# Patient Record
Sex: Female | Born: 1995 | Race: White | Hispanic: No | Marital: Single | Smoking: Never smoker
Health system: Southern US, Community
[De-identification: ages and names within clinical notes are randomized; demographics above are authoritative.]

## PROBLEM LIST (undated history)

## (undated) DIAGNOSIS — J40 Bronchitis, not specified as acute or chronic: Secondary | ICD-10-CM

## (undated) DIAGNOSIS — F419 Anxiety disorder, unspecified: Secondary | ICD-10-CM

## (undated) DIAGNOSIS — M419 Scoliosis, unspecified: Secondary | ICD-10-CM

## (undated) DIAGNOSIS — M5430 Sciatica, unspecified side: Secondary | ICD-10-CM

## (undated) DIAGNOSIS — Z8701 Personal history of pneumonia (recurrent): Secondary | ICD-10-CM

## (undated) DIAGNOSIS — T7840XA Allergy, unspecified, initial encounter: Secondary | ICD-10-CM

## (undated) HISTORY — DX: Scoliosis, unspecified: M41.9

## (undated) HISTORY — DX: Sciatica, unspecified side: M54.30

## (undated) HISTORY — DX: Allergy, unspecified, initial encounter: T78.40XA

## (undated) HISTORY — DX: Anxiety disorder, unspecified: F41.9

## (undated) HISTORY — DX: Personal history of pneumonia (recurrent): Z87.01

## (undated) HISTORY — PX: WISDOM TOOTH EXTRACTION: SHX21

## (undated) HISTORY — DX: Bronchitis, not specified as acute or chronic: J40

---
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
## 2016-06-29 ENCOUNTER — Ambulatory Visit: Payer: Medicaid Other | Admitting: Physical Therapy

## 2016-07-06 ENCOUNTER — Encounter: Payer: Medicaid Other | Admitting: Physical Therapy

## 2016-07-09 ENCOUNTER — Encounter: Payer: Medicaid Other | Admitting: Physical Therapy

## 2017-06-21 DIAGNOSIS — Z00129 Encounter for routine child health examination without abnormal findings: Secondary | ICD-10-CM | POA: Diagnosis not present

## 2017-06-21 DIAGNOSIS — Z0000 Encounter for general adult medical examination without abnormal findings: Secondary | ICD-10-CM | POA: Diagnosis not present

## 2017-06-21 DIAGNOSIS — Z713 Dietary counseling and surveillance: Secondary | ICD-10-CM | POA: Diagnosis not present

## 2017-09-21 DIAGNOSIS — F411 Generalized anxiety disorder: Secondary | ICD-10-CM | POA: Diagnosis not present

## 2017-10-04 DIAGNOSIS — F411 Generalized anxiety disorder: Secondary | ICD-10-CM | POA: Diagnosis not present

## 2017-10-06 DIAGNOSIS — F411 Generalized anxiety disorder: Secondary | ICD-10-CM | POA: Diagnosis not present

## 2017-10-13 DIAGNOSIS — F411 Generalized anxiety disorder: Secondary | ICD-10-CM | POA: Diagnosis not present

## 2017-10-20 DIAGNOSIS — F411 Generalized anxiety disorder: Secondary | ICD-10-CM | POA: Diagnosis not present

## 2017-10-27 DIAGNOSIS — F411 Generalized anxiety disorder: Secondary | ICD-10-CM | POA: Diagnosis not present

## 2017-11-03 DIAGNOSIS — F411 Generalized anxiety disorder: Secondary | ICD-10-CM | POA: Diagnosis not present

## 2017-11-10 DIAGNOSIS — F411 Generalized anxiety disorder: Secondary | ICD-10-CM | POA: Diagnosis not present

## 2017-11-17 DIAGNOSIS — F411 Generalized anxiety disorder: Secondary | ICD-10-CM | POA: Diagnosis not present

## 2017-12-22 DIAGNOSIS — F411 Generalized anxiety disorder: Secondary | ICD-10-CM | POA: Diagnosis not present

## 2018-01-26 DIAGNOSIS — F411 Generalized anxiety disorder: Secondary | ICD-10-CM | POA: Diagnosis not present

## 2018-02-21 DIAGNOSIS — F411 Generalized anxiety disorder: Secondary | ICD-10-CM | POA: Diagnosis not present

## 2018-03-23 DIAGNOSIS — F411 Generalized anxiety disorder: Secondary | ICD-10-CM | POA: Diagnosis not present

## 2018-07-19 DIAGNOSIS — M4120 Other idiopathic scoliosis, site unspecified: Secondary | ICD-10-CM | POA: Diagnosis not present

## 2018-07-19 DIAGNOSIS — Z23 Encounter for immunization: Secondary | ICD-10-CM | POA: Diagnosis not present

## 2018-07-19 DIAGNOSIS — Z0000 Encounter for general adult medical examination without abnormal findings: Secondary | ICD-10-CM | POA: Diagnosis not present

## 2018-07-19 DIAGNOSIS — J309 Allergic rhinitis, unspecified: Secondary | ICD-10-CM | POA: Diagnosis not present

## 2018-07-19 DIAGNOSIS — M40209 Unspecified kyphosis, site unspecified: Secondary | ICD-10-CM | POA: Diagnosis not present

## 2018-07-19 DIAGNOSIS — Z6823 Body mass index (BMI) 23.0-23.9, adult: Secondary | ICD-10-CM | POA: Diagnosis not present

## 2018-07-19 DIAGNOSIS — Z713 Dietary counseling and surveillance: Secondary | ICD-10-CM | POA: Diagnosis not present

## 2018-09-19 DIAGNOSIS — Z124 Encounter for screening for malignant neoplasm of cervix: Secondary | ICD-10-CM | POA: Diagnosis not present

## 2018-09-19 DIAGNOSIS — Z113 Encounter for screening for infections with a predominantly sexual mode of transmission: Secondary | ICD-10-CM | POA: Diagnosis not present

## 2018-10-16 DIAGNOSIS — F411 Generalized anxiety disorder: Secondary | ICD-10-CM | POA: Diagnosis not present

## 2019-05-02 ENCOUNTER — Encounter: Payer: Self-pay | Admitting: Family Medicine

## 2019-05-02 ENCOUNTER — Ambulatory Visit (INDEPENDENT_AMBULATORY_CARE_PROVIDER_SITE_OTHER): Payer: Medicaid Other | Admitting: Family Medicine

## 2019-05-02 ENCOUNTER — Other Ambulatory Visit: Payer: Self-pay

## 2019-05-02 VITALS — BP 116/78 | HR 95 | Temp 98.5°F | Resp 14 | Ht 61.0 in | Wt 127.9 lb

## 2019-05-02 DIAGNOSIS — Z7689 Persons encountering health services in other specified circumstances: Secondary | ICD-10-CM

## 2019-05-02 DIAGNOSIS — J4520 Mild intermittent asthma, uncomplicated: Secondary | ICD-10-CM | POA: Diagnosis not present

## 2019-05-02 DIAGNOSIS — Z7251 High risk heterosexual behavior: Secondary | ICD-10-CM

## 2019-05-02 DIAGNOSIS — F419 Anxiety disorder, unspecified: Secondary | ICD-10-CM | POA: Diagnosis not present

## 2019-05-02 DIAGNOSIS — Z9109 Other allergy status, other than to drugs and biological substances: Secondary | ICD-10-CM | POA: Diagnosis not present

## 2019-05-02 DIAGNOSIS — Z8701 Personal history of pneumonia (recurrent): Secondary | ICD-10-CM | POA: Diagnosis not present

## 2019-05-02 DIAGNOSIS — Z3009 Encounter for other general counseling and advice on contraception: Secondary | ICD-10-CM

## 2019-05-02 MED ORDER — NORETHIN ACE-ETH ESTRAD-FE 1-20 MG-MCG PO TABS: 1.0000 | ORAL_TABLET | Freq: Every day | ORAL | 11 refills | Status: DC

## 2019-05-02 NOTE — Progress Notes (Signed)
Name: Crystal Hensley   MRN: [id number]    DOB: [birth year]   Date:05/02/2019       Progress Note  Chief Complaint  Patient presents with  . Establish Care    see's psych at [facility] for depression and med  . Contraception    currently on junel pill wants to see if she can switch to shots, so she want forget, Recently tried to have a pap at [facility] unable to do so do to pain     Subjective:   Crystal Hensley is a 24 y.o. female, presents to clinic to establish care  Pt is here with her aunt [place] S[place] (Celine Ahr on her dads side) mom just died last week? Is a very confusing history given about her biological mom and her other mom who raised her and one of her mom's just died but she also just moved in with one of her mom's.  Patient is a poor historian when attempting to redirect or clarify questions. Later after some clarification with her aunt who is here, her biological mom only had her for the first 2 weeks of life and then she went to live with one of her aunts was essentially her mom and raised her her entire life.  About a month ago she passed away suddenly, they did not discuss reason about what caused her death, but the patient then went to live with her biological mom in the past month for the first time ever and she is living with her mom and her mom's boyfriend and her spouse's stepfather? Since living there a lot of change in her life, she reports going to times a week to give plasma donations and the patient mentions that she is concerned about bruising.  Patient has a history of anxiety and depression?  This is managed at [facility] She is on zoloft 100 mg has been on this for years. Her psychiatrist passed away recently and she is going to see a new md coming up on her 6 month f/up.  She has not had any trouble getting her medications refilled at the clinic.  She does not know she has had any other specific diagnoses made about her.  There is some family history of some psychiatric or developmental and  intellectual disabilities or illness but neither  Sink or the patient state any specific known family history or diagnoses.  Patient also has a history of pneumonia and bronchitis/ "touch of bronchitis" uses inhalers at least once a year, but they do not believe that she is ever been diagnosed with asthma.  Patient did want to discuss switching birth control possibly to a different form like a Depo shot.  She is currently on Janel, she is sexually active and has had more than 1 female partner in the past year.  Patient is extremely anxious about letter her medical care and unfortunately they attempted to do a Pap smear last July but it was unsuccessful she had severe anxiety and that Pap was never attempted in office.  Similar problems going to the dentist but they been able to work through this. Reviewed records from that encounter  Assessment and Plan   24 y.o. G0 here for pap smear and STI testing  #. Health Maintenance: Unable to perform pap today 2/2 patient intolerance. Recommended return to clinic with PO ativan vs IV light sedation.  -- Attempted to collect urine specimen for GCCT; however, pt was not able to void.  -- Will RTC prn  #.  Contraception counseling: Pt endorses HA with OCPs and would like to try a different OCP. Declined all other forms of contraception.  -- Rx Sprintec  Sharen Hint, MD, MPH Complex Family Planning Fellow, [facility] Date: 09/19/2018 Time: 12:45 PM  Patient is tolerating the [place] birth control and she is taking it regularly she reports light periods that are fairly regular with withdrawal bleed 3-4 days  No hematuria, bleeding with brushing teeth, spontaneous    Plasma Ked - donating 2x a week concerned with bleeding/bruising   Discuss separately with the and inquiring about any further known medical history with the patient or with her family.  She states that she suspects the patient and her mother both have some slight intellectual  disability but unclear if patient functions fully independent or will need a guardian?  She does have difficult time answering questions appropriately she is a poor historian    Patient Active Problem List   Diagnosis Date Noted  . Anxiety     Past Surgical History:  Procedure Laterality Date  . WISDOM TOOTH EXTRACTION      Family History  Problem Relation Age of Onset  . Depression Mother   . Diabetes Maternal Grandmother   . Arthritis Maternal Grandmother   . COPD Maternal Grandmother   . Lung cancer Maternal Grandfather   . Pneumonia Maternal Grandfather   . Heart disease Maternal Grandfather   . Diabetes Paternal Grandmother   . COPD Paternal Grandmother   . Hypertension Paternal Grandmother     Social History   Tobacco Use  . Smoking status: Never Smoker  . Smokeless tobacco: Never Used  Substance Use Topics  . Alcohol use: Never  . Drug use: Never      Current Outpatient Medications:  .  norgestimate-ethinyl estradiol (ORTHO-CYCLEN) 0.25-35 MG-MCG tablet, Take by mouth., Disp: , Rfl:  .  sertraline (ZOLOFT) 100 MG tablet, Take 100 mg by mouth daily., Disp: , Rfl:   No Known Allergies  Chart Review Today: I personally reviewed active problem list, medication list, allergies, family history, social history, health maintenance, notes from last encounter, lab results, imaging with the patient/caregiver today.   Review of Systems  10 Systems reviewed and are negative for acute change except as noted in the HPI.     Objective:    Vitals:   05/02/19 1441  BP: 116/78  Pulse: 95  Resp: 14  Temp: 98.5 F (36.9 C)  SpO2: 99%  Weight: 127 lb 14.4 oz (58 kg)  Height: 5\' 1"  (1.549 m)    Body mass index is 24.17 kg/m.  Physical Exam Vitals and nursing note reviewed.  Constitutional:      General: She is not in acute distress.    Appearance: Normal appearance. She is well-developed. She is not ill-appearing, toxic-appearing or diaphoretic.      Interventions: Face mask in place.  HENT:     Head: Normocephalic and atraumatic.     Right Ear: External ear normal.     Left Ear: External ear normal.  Eyes:     General: Lids are normal. No scleral icterus.       Right eye: No discharge.        Left eye: No discharge.     Conjunctiva/sclera: Conjunctivae normal.  Neck:     Trachea: Phonation normal. No tracheal deviation.  Cardiovascular:     Rate and Rhythm: Normal rate and regular rhythm.     Pulses: Normal pulses.  Radial pulses are 2+ on the right side and 2+ on the left side.       Posterior tibial pulses are 2+ on the right side and 2+ on the left side.     Heart sounds: Normal heart sounds. No murmur. No friction rub. No gallop.   Pulmonary:     Effort: Pulmonary effort is normal. No respiratory distress.     Breath sounds: Normal breath sounds. No stridor. No wheezing, rhonchi or rales.  Chest:     Chest wall: No tenderness.  Abdominal:     General: Bowel sounds are normal. There is no distension.     Palpations: Abdomen is soft.     Tenderness: There is no abdominal tenderness. There is no guarding or rebound.  Musculoskeletal:        General: No deformity. Normal range of motion.     Cervical back: Normal range of motion and neck supple.     Right lower leg: No edema.     Left lower leg: No edema.  Lymphadenopathy:     Cervical: No cervical adenopathy.  Skin:    General: Skin is warm and dry.     Capillary Refill: Capillary refill takes less than 2 seconds.     Coloration: Skin is not jaundiced or pale.     Findings: No rash.  Neurological:     Mental Status: She is alert.     Motor: No abnormal muscle tone.     Gait: Gait normal.  Psychiatric:        Attention and Perception: She is inattentive.        Mood and Affect: Mood is anxious. Mood is not depressed.        Thought Content: Thought content does not include homicidal or suicidal ideation.     Comments: Slightly hyperactive, inattentive and  anxious appearing Poor eye contact Does not answer questions appropriately -has trouble answering questions directly and when discussing certain things she will correct me on pronunciation or spelling of medications       PHQ2/9: Depression screen PHQ 2/9 05/02/2019  Decreased Interest 0  Down, Depressed, Hopeless 1  PHQ - 2 Score 1  Altered sleeping 0  Tired, decreased energy 0  Change in appetite 0  Feeling bad or failure about yourself  0  Trouble concentrating 0  Moving slowly or fidgety/restless 0  Suicidal thoughts 0  PHQ-9 Score 1    phq 9 is neg  Fall Risk: Fall Risk  05/02/2019  Falls in the past year? 0  Number falls in past yr: 0  Injury with Fall? 0    Functional Status Survey: Is the patient deaf or have difficulty hearing?: No Does the patient have difficulty seeing, even when wearing glasses/contacts?: No Does the patient have difficulty concentrating, remembering, or making decisions?: No Does the patient have difficulty walking or climbing stairs?: No Does the patient have difficulty dressing or bathing?: No Does the patient have difficulty doing errands alone such as visiting a doctor's office or shopping?: No   Assessment & Plan:     ICD-10-CM   1. Anxiety  F41.9    Unspecified diagnosis or history requesting [facility] records to see past formal assessments  2. Multiple environmental allergies  Z91.09    Encouraged use of over-the-counter antihistamines and steroid nasal sprays avoiding triggers  3. High risk sexual behavior, unspecified type  Z72.51    Encouraged her to use condoms, to be compliant with birth control to avoid pregnancy  4. Birth control counseling  Z30.09    Discussed sexual health, pelvic exams and cervical cancer screening, discussed the pill versus Depo shot they will return once you had time to decide  5. Mild intermittent asthmatic bronchitis without complication  J45.20    Given history suspicious for some mild asthma or asthmatic  bronchitis triggered by living with secondhand smoke exposure has improved since moving  6. History of pneumonia  Z87.01    History of pneumonia and bronchitis no recent pneumonia no known complications at birth or hospitalizations in the past  7. Encounter to establish care with new doctor  [phone]    Requesting records from psychiatry, reviewed available records through care everywhere and our own EMR   Unclear if the patient has some developmental or intellectual disabilities or possibly has some slight autism or -asked Asperger's? or is on the spectrum?  She behaves very childlike and has a nervous energy is also slightly aloof and then sometimes very specific and articulate incorrectly me on pronunciation of medications or large words but other times cannot answer questions appropriately or give a clear coherent history She is with her aunt but does seem to have lived with multiple other family members and its unclear if she is fully dependent or not?  I hope to get records from [facility] to see if there is any past formal assessment related to this with either her intellect, behavior, past psychiatric diagnoses or assessments?    Patient will return for further discussion of birth control options -Depo may be a great choice for her    Return for f/up in 2 months for CPE/birth control 40 min.   Danelle Berry, PA-C 05/02/19 3:08 PM

## 2019-05-02 NOTE — Patient Instructions (Signed)
Mychart Login name:  [email or web address]  Password:    I reset   Password123

## 2019-05-09 ENCOUNTER — Encounter: Payer: Self-pay | Admitting: Family Medicine

## 2019-05-11 ENCOUNTER — Encounter: Payer: Self-pay | Admitting: Family Medicine

## 2019-05-15 ENCOUNTER — Ambulatory Visit (INDEPENDENT_AMBULATORY_CARE_PROVIDER_SITE_OTHER): Payer: Medicaid Other | Admitting: Family Medicine

## 2019-05-15 ENCOUNTER — Encounter: Payer: Self-pay | Admitting: Family Medicine

## 2019-05-15 ENCOUNTER — Other Ambulatory Visit: Payer: Self-pay

## 2019-05-15 VITALS — BP 116/78 | HR 84 | Temp 98.3°F | Resp 14 | Ht 62.0 in | Wt 131.0 lb

## 2019-05-15 DIAGNOSIS — Z658 Other specified problems related to psychosocial circumstances: Secondary | ICD-10-CM | POA: Diagnosis not present

## 2019-05-15 DIAGNOSIS — R21 Rash and other nonspecific skin eruption: Secondary | ICD-10-CM

## 2019-05-15 MED ORDER — HYDROXYZINE HCL 10 MG PO TABS: 10.0000 mg | ORAL_TABLET | Freq: Three times a day (TID) | ORAL | 1 refills | Status: DC | PRN

## 2019-05-15 MED ORDER — CETIRIZINE HCL 10 MG PO TABS: 10.0000 mg | ORAL_TABLET | Freq: Every day | ORAL | 11 refills | Status: DC

## 2019-05-15 MED ORDER — PREDNISONE 20 MG PO TABS: 40.0000 mg | ORAL_TABLET | Freq: Every day | ORAL | 0 refills | Status: AC

## 2019-05-15 NOTE — Patient Instructions (Addendum)
For rash start taking a daily zyrtec (prescribed for you) and use vaseline to hydrate skin.  Start the prednisone tomorrow morning for wide spread rash  You can use hydroxyzine as needed for itching    Atopic Dermatitis Atopic dermatitis is a skin disorder that causes inflammation of the skin. This is the most common type of eczema. Eczema is a group of skin conditions that cause the skin to be itchy, red, and swollen. This condition is generally worse during the cooler winter months and often improves during the warm summer months. Symptoms can vary from person to person. Atopic dermatitis usually starts showing signs in infancy and can last through adulthood. This condition cannot be passed from one person to another (non-contagious), but it is more common in families. Atopic dermatitis may not always be present. When it is present, it is called a flare-up. What are the causes? The exact cause of this condition is not known. Flare-ups of the condition may be triggered by:  Contact with something that you are sensitive or allergic to.  Stress.  Certain foods.  Extremely hot or cold weather.  Harsh chemicals and soaps.  Dry air.  Chlorine. What increases the risk? This condition is more likely to develop in people who have a personal history or family history of eczema, allergies, asthma, or hay fever. What are the signs or symptoms? Symptoms of this condition include:  Dry, scaly skin.  Red, itchy rash.  Itchiness, which can be severe. This may occur before the skin rash. This can make sleeping difficult.  Skin thickening and cracking that can occur over time. How is this diagnosed? This condition is diagnosed based on your symptoms, a medical history, and a physical exam. How is this treated? There is no cure for this condition, but symptoms can usually be controlled. Treatment focuses on:  Controlling the itchiness and scratching. You may be given medicines, such as  antihistamines or steroid creams.  Limiting exposure to things that you are sensitive or allergic to (allergens).  Recognizing situations that cause stress and developing a plan to manage stress. If your atopic dermatitis does not get better with medicines, or if it is all over your body (widespread), a treatment using a specific type of light (phototherapy) may be used. Follow these instructions at home: Skin care   Keep your skin well-moisturized. Doing this seals in moisture and helps to prevent dryness. ? Use unscented lotions that have petroleum in them. ? Avoid lotions that contain alcohol or water. They can dry the skin.  Keep baths or showers short (less than 5 minutes) in warm water. Do not use hot water. ? Use mild, unscented cleansers for bathing. Avoid soap and bubble bath. ? Apply a moisturizer to your skin right after a bath or shower.  Do not apply anything to your skin without checking with your health care provider. General instructions  Dress in clothes made of cotton or cotton blends. Dress lightly because heat increases itchiness.  When washing your clothes, rinse your clothes twice so all of the soap is removed.  Avoid any triggers that can cause a flare-up.  Try to manage your stress.  Keep your fingernails cut short.  Avoid scratching. Scratching makes the rash and itchiness worse. It may also result in a skin infection (impetigo) due to a break in the skin caused by scratching.  Take or apply over-the-counter and prescription medicines only as told by your health care provider.  Keep all follow-up visits as  told by your health care provider. This is important.  Do not be around people who have cold sores or fever blisters. If you get the infection, it may cause your atopic dermatitis to worsen. Contact a health care provider if:  Your itchiness interferes with sleep.  Your rash gets worse or it is not better within one week of starting  treatment.  You have a fever.  You have a rash flare-up after having contact with someone who has cold sores or fever blisters. Get help right away if:  You develop pus or soft yellow scabs in the rash area. Summary  This condition causes a red rash and itchy, dry, scaly skin.  Treatment focuses on controlling the itchiness and scratching, limiting exposure to things that you are sensitive or allergic to (allergens), recognizing situations that cause stress, and developing a plan to manage stress.  Keep your skin well-moisturized.  Keep baths or showers shorter than 5 minutes and use warm water. Do not use hot water. This information is not intended to replace advice given to you by your health care provider. Make sure you discuss any questions you have with your health care provider. Document Revised: 05/30/2018 Document Reviewed: 03/12/2016 Elsevier Patient Education  2020 [employer].

## 2019-05-15 NOTE — Progress Notes (Signed)
Patient ID: Crystal Hensley, female    DOB: 09/07/1995, 24 y.o.   MRN: [id number]  PCP: Delsa Grana, PA-C  Chief Complaint  Patient presents with  . Letter for School/Work    for SS for her to care for self/referral    Subjective:   Crystal Hensley is a 24 y.o. female, presents to clinic with CC of the following:  HPI  Patient presents by herself, her aunt is not with her today, she does not bring any paperwork with her either but she is here to request a letter so that she can state that she is able to take care of herself so that she is able to get her Social Security monies and manage them on her own. Pt was raised by her Crystal Hensley just passed away 2-3 weeks ago.  Her Overly had POA and got money for her every month.  She is currently living her with biological mom Crystal Hensley and her boyfriend -family history given by the aunt and somewhat confirmed by the patient today is that patient's biological mother has some diagnoses such as intellectual development or some psychiatric conditions which the Department of Social Services has deemed they are not eligible to manage Crystal Hensley's money for her and they are not eligible to be her POA.   Patient's medical history is still not yet known because she came by her's self today previously came with her and in the history is very convoluted had no records available from pediatrics or from psychiatry though we have requested them in the past.  She is going to [employer] today.  She states that when she turned 21 and 36 POA was designated she was at that time assessed and seeing a psychiatrist at [facility].  Her psychiatrist Dr. Randel Books recently passed away and she is currently seeing Dr. Kari Baars?   Patient also complains of a itchy rash and bumps that are all over her body.  Her visit last week she had scratches across her back from itching but that time there was no visible rash.  She reports dry skin and bumps all over her body she pulls up her legs and there  are scratches and bumps scattered everywhere, no new known soaps detergents new medications lotions soaps or body wash but she did recently move from her aunts house to her mother's house.  She reports some history of eczema and allergies that are not managed by any over-the-counter or prescription medications.  She reports putting on some lotion that does not help much but she does not know what it is called    Patient Active Problem List   Diagnosis Date Noted  . Anxiety       Current Outpatient Medications:  .  norethindrone-ethinyl estradiol (JUNEL FE 1/20) 1-20 MG-MCG tablet, Take 1 tablet by mouth daily., Disp: 1 Package, Rfl: 11 .  sertraline (ZOLOFT) 100 MG tablet, Take 100 mg by mouth daily., Disp: , Rfl:    No Known Allergies   Family History  Problem Relation Age of Onset  . Depression Mother   . Diabetes Maternal Grandmother   . Arthritis Maternal Grandmother   . COPD Maternal Grandmother   . Asthma Maternal Grandmother   . Lung cancer Maternal Grandfather   . Pneumonia Maternal Grandfather   . Heart disease Maternal Grandfather   . Diabetes Paternal Grandmother   . COPD Paternal Grandmother   . Hypertension Paternal Grandmother      Social History  Socioeconomic History  . Marital status: Single    Spouse name: Not on file  . Number of children: Not on file  . Years of education: Not on file  . Highest education level: Not on file  Occupational History  . Not on file  Tobacco Use  . Smoking status: Never Smoker  . Smokeless tobacco: Never Used  Substance and Sexual Activity  . Alcohol use: Never  . Drug use: Never  . Sexual activity: Yes    Birth control/protection: Pill  Other Topics Concern  . Not on file  Social History Narrative  . Not on file   Social Determinants of Health   Financial Resource Strain:   . Difficulty of Paying Living Expenses:   Food Insecurity:   . Worried About Programme researcher, broadcasting/film/video in the Last Year:   . Engineer, site in the Last Year:   Transportation Needs:   . Freight forwarder (Medical):   Marland Kitchen Lack of Transportation (Non-Medical):   Physical Activity:   . Days of Exercise per Week:   . Minutes of Exercise per Session:   Stress:   . Feeling of Stress :   Social Connections:   . Frequency of Communication with Friends and Family:   . Frequency of Social Gatherings with Friends and Family:   . Attends Religious Services:   . Active Member of Clubs or Organizations:   . Attends Banker Meetings:   Marland Kitchen Marital Status:   Intimate Partner Violence:   . Fear of Current or Ex-Partner:   . Emotionally Abused:   Marland Kitchen Physically Abused:   . Sexually Abused:     Chart Review Today: I personally reviewed active problem list, medication list, allergies, family history, social history, health maintenance, notes from last encounter, lab results, imaging with the patient/caregiver today.   Review of Systems 10 Systems reviewed and are negative for acute change except as noted in the HPI.     Objective:   Vitals:   05/15/19 1328  BP: 116/78  Pulse: 84  Resp: 14  Temp: 98.3 F (36.8 C)  SpO2: 99%  Weight: 131 lb (59.4 kg)  Height: 5\' 2"  (1.575 m)    Body mass index is 23.96 kg/m.  Physical Exam Vitals and nursing note reviewed.  Constitutional:      General: She is not in acute distress.    Appearance: She is well-developed. She is not ill-appearing, toxic-appearing or diaphoretic.  HENT:     Head: Normocephalic and atraumatic.     Nose: Nose normal.  Eyes:     General:        Right eye: No discharge.        Left eye: No discharge.     Conjunctiva/sclera: Conjunctivae normal.  Neck:     Trachea: No tracheal deviation.  Cardiovascular:     Rate and Rhythm: Normal rate and regular rhythm.  Pulmonary:     Effort: Pulmonary effort is normal. No respiratory distress.     Breath sounds: No stridor.  Musculoskeletal:        General: Normal range of motion.  Skin:     General: Skin is warm and dry.     Findings: Rash present.     Comments: Scattered maculopapular rash to her arms and legs with scattered excoriations to her back chest arms and legs  Neurological:     Mental Status: She is alert.     Motor: No abnormal muscle tone.  Psychiatric:  Attention and Perception: She is inattentive. She does not perceive auditory or visual hallucinations.        Mood and Affect: Mood is anxious.        Speech: Speech is rapid and pressured and tangential.        Behavior: Behavior is hyperactive.     Comments: Difficulty answering questions, poor eye contact, very rapid speech, difficult to redirect patient      No results found for this or any previous visit.      Assessment & Plan:      ICD-10-CM   1. Unresolved independence-dependence conflict  Z62.8    POA with aunt who passed away, needs eval for competency to take care of herself and get her own money, goes to [facility], [facility]. Bard Herbert [facility] psych 5 yrs ago  2. Rash and nonspecific skin eruption  R21 hydrOXYzine (ATARAX/VISTARIL) 10 MG tablet    predniSONE (DELTASONE) 20 MG tablet    cetirizine (ZYRTEC) 10 MG tablet   eczema vs dermatitis? start antihistamine, steroid taper, use vaseline and heavier creams approved by dermatologist     Danelle Berry, PA-C 05/15/19 1:42 PM

## 2019-05-16 DIAGNOSIS — F411 Generalized anxiety disorder: Secondary | ICD-10-CM | POA: Diagnosis not present

## 2019-05-24 ENCOUNTER — Encounter: Payer: Self-pay | Admitting: Family Medicine

## 2019-05-24 DIAGNOSIS — F411 Generalized anxiety disorder: Secondary | ICD-10-CM | POA: Diagnosis not present

## 2019-05-28 ENCOUNTER — Ambulatory Visit: Payer: Medicaid Other | Admitting: Family Medicine

## 2019-05-31 DIAGNOSIS — F411 Generalized anxiety disorder: Secondary | ICD-10-CM | POA: Diagnosis not present

## 2019-06-14 DIAGNOSIS — F411 Generalized anxiety disorder: Secondary | ICD-10-CM | POA: Diagnosis not present

## 2019-06-21 ENCOUNTER — Encounter: Payer: Self-pay | Admitting: Family Medicine

## 2019-06-21 DIAGNOSIS — F411 Generalized anxiety disorder: Secondary | ICD-10-CM | POA: Diagnosis not present

## 2019-06-28 ENCOUNTER — Encounter: Payer: Self-pay | Admitting: Family Medicine

## 2019-06-28 DIAGNOSIS — F411 Generalized anxiety disorder: Secondary | ICD-10-CM | POA: Diagnosis not present

## 2019-07-02 ENCOUNTER — Other Ambulatory Visit: Payer: Self-pay

## 2019-07-02 ENCOUNTER — Encounter: Payer: Self-pay | Admitting: Family Medicine

## 2019-07-02 ENCOUNTER — Ambulatory Visit (INDEPENDENT_AMBULATORY_CARE_PROVIDER_SITE_OTHER): Payer: Medicaid Other | Admitting: Family Medicine

## 2019-07-02 VITALS — BP 122/64 | HR 88 | Temp 98.5°F | Resp 14 | Ht 62.0 in | Wt 134.3 lb

## 2019-07-02 DIAGNOSIS — Z113 Encounter for screening for infections with a predominantly sexual mode of transmission: Secondary | ICD-10-CM | POA: Diagnosis not present

## 2019-07-02 DIAGNOSIS — Z30013 Encounter for initial prescription of injectable contraceptive: Secondary | ICD-10-CM | POA: Diagnosis not present

## 2019-07-02 DIAGNOSIS — Z0000 Encounter for general adult medical examination without abnormal findings: Secondary | ICD-10-CM | POA: Diagnosis not present

## 2019-07-02 DIAGNOSIS — Z7251 High risk heterosexual behavior: Secondary | ICD-10-CM

## 2019-07-02 DIAGNOSIS — Z23 Encounter for immunization: Secondary | ICD-10-CM | POA: Diagnosis not present

## 2019-07-02 MED ORDER — MEDROXYPROGESTERONE ACETATE 150 MG/ML IM SUSP: 150.0000 mg | INTRAMUSCULAR | 3 refills | Status: AC

## 2019-07-02 MED ORDER — TRIAMCINOLONE ACETONIDE 0.1 % EX OINT: 1.0000 "application " | TOPICAL_OINTMENT | Freq: Two times a day (BID) | CUTANEOUS | 1 refills | Status: DC

## 2019-07-02 NOTE — Patient Instructions (Addendum)
Preventive Care young adult Female  Preventive care refers to lifestyle choices and visits with your health care provider that can promote health and wellness. At this stage in your life, you may start seeing a primary care physician instead of a pediatrician. Your health care is now your responsibility. Preventive care for young adults includes:  A yearly physical exam. This is also called an annual wellness visit.  Regular dental and eye exams.  Immunizations.  Screening for certain conditions.  Healthy lifestyle choices, such as diet and exercise. What can I expect for my preventive care visit? Physical exam Your health care provider may check:  Height and weight. These may be used to calculate body mass index (BMI), which is a measurement that tells if you are at a healthy weight.  Heart rate and blood pressure.  Body temperature. Counseling Your health care provider may ask you questions about:  Past medical problems and family medical history.  Alcohol, tobacco, and drug use.  Home and relationship well-being.  Access to firearms.  Emotional well-being.  Diet, exercise, and sleep habits.  Sexual activity and sexual health.  Method of birth control.  Menstrual cycle.  Pregnancy history. What immunizations do I need?  Influenza (flu) vaccine  This is recommended every year. Tetanus, diphtheria, and pertussis (Tdap) vaccine  You may need a Td booster every 10 years. Varicella (chickenpox) vaccine  You may need this vaccine if you have not already been vaccinated. Human papillomavirus (HPV) vaccine  If recommended by your health care provider, you may need three doses over 6 months. Measles, mumps, and rubella (MMR) vaccine  You may need at least one dose of MMR. You may also need a second dose. Meningococcal conjugate (MenACWY) vaccine  One dose is recommended if you are 25-26 years old and a Market researcher living in a residence hall, or  if you have one of several medical conditions. You may also need additional booster doses. Pneumococcal conjugate (PCV13) vaccine  You may need this if you have certain conditions and were not previously vaccinated. Pneumococcal polysaccharide (PPSV23) vaccine  You may need one or two doses if you smoke cigarettes or if you have certain conditions. Hepatitis A vaccine  You may need this if you have certain conditions or if you travel or work in places where you may be exposed to hepatitis A. Hepatitis B vaccine  You may need this if you have certain conditions or if you travel or work in places where you may be exposed to hepatitis B. Haemophilus influenzae type b (Hib) vaccine  You may need this if you have certain risk factors. You may receive vaccines as individual doses or as more than one vaccine together in one shot (combination vaccines). Talk with your health care provider about the risks and benefits of combination vaccines. What tests do I need? Blood tests  Lipid and cholesterol levels. These may be checked every 5 years starting at age 24.  Hepatitis C test.  Hepatitis B test. Screening  Pelvic exam and Pap test. This may be done every 3 years starting at age 36.  Sexually transmitted disease (STD) testing, if you are at risk.  BRCA-related cancer screening. This may be done if you have a family history of breast, ovarian, tubal, or peritoneal cancers. Other tests  Tuberculosis skin test.  Vision and hearing tests.  Skin exam.  Breast exam. Follow these instructions at home: Eating and drinking   Eat a diet that includes fresh fruits and  vegetables, whole grains, lean protein, and low-fat dairy products.  Drink enough fluid to keep your urine pale yellow.  Do not drink alcohol if: ? Your health care provider tells you not to drink. ? You are pregnant, may be pregnant, or are planning to become pregnant. ? You are under the legal drinking age. In the  [place] the legal drinking age is 70.  If you drink alcohol: ? Limit how much you have to 0-1 drink a day. ? Be aware of how much alcohol is in your drink. In the [place] one drink equals one 12 oz bottle of beer (355 mL), one 5 oz glass of wine (148 mL), or one 1 oz glass of hard liquor (44 mL). Lifestyle  Take daily care of your teeth and gums.  Stay active. Exercise at least 30 minutes 5 or more days of the week.  Do not use any products that contain nicotine or tobacco, such as cigarettes, e-cigarettes, and chewing tobacco. If you need help quitting, ask your health care provider.  Do not use drugs.  If you are sexually active, practice safe sex. Use a condom or other form of birth control (contraception) in order to prevent pregnancy and STIs (sexually transmitted infections). If you plan to become pregnant, see your health care provider for a pre-conception visit.  Find healthy ways to cope with stress, such as: ? Meditation, yoga, or listening to music. ? Journaling. ? Talking to a trusted person. ? Spending time with friends and family. Safety  Always wear your seat belt while driving or riding in a vehicle.  Do not drive if you have been drinking alcohol. Do not ride with someone who has been drinking.  Do not drive when you are tired or distracted. Do not text while driving.  Wear a helmet and other protective equipment during sports activities.  If you have firearms in your house, make sure you follow all gun safety procedures.  Seek help if you have been bullied, physically abused, or sexually abused.  Use the Internet responsibly to avoid dangers such as online bullying and online sex predators. What's next?  Go to your health care provider once a year for a well check visit.  Ask your health care provider how often you should have your eyes and teeth checked.  Stay up to date on all vaccines. This information is not intended to replace advice given to you by  your health care provider. Make sure you discuss any questions you have with your health care provider. Document Revised: 02/02/2018 Document Reviewed: 02/02/2018 Elsevier Patient Education  2020 [employer].

## 2019-07-02 NOTE — Progress Notes (Signed)
Patient: Crystal Hensley, Female    DOB: [birth year], 24 y.o.   MRN: [id number] Crystal Hensley Visit Date: 07/02/2019  Today's Provider: Delsa Grana, Hensley   Chief Complaint  Patient presents with  . Annual Exam    discuss birth control, started period yesterday   Subjective:   Annual physical exam:  Pt is here with her Aunt - sounds like she may become her guardian soon?  Crystal Hensley is a 24 y.o. female who presents today for complete physical exam:  Exercise/Activity:  A lot of walking Diet/nutrition:  Waffles, pizza, pancakes, chicken last night Sleep:  Good, sleeps about 7-8 hours, goes to bed at 10 am, wakes up at 7 am  RHA:  Requesting records   Moved out of moms house and living with a family friend 5 miles from aunt Crystal Hensley Still donating plasma Rash got better with steroids, but pt is having trouble with taking meds if they change doses - trouble taking her birth control pills, on and off packs for the past couple months, she has a pack with her and started the placebo week  Interested in starting depo provera shot to avoid    USPSTF grade A and B recommendations - reviewed and addressed today  Depression:  Phq 9 completed today by patient, was reviewed by me with patient in the room PHQ score is good, pt feels good PHQ 2/9 Scores 07/02/2019 05/15/2019 05/02/2019  PHQ - 2 Score 1 1 1   PHQ- 9 Score 1 1 1    Depression screen [facility] 2/9 07/02/2019 05/15/2019 05/02/2019  Decreased Interest 0 0 0  Down, Depressed, Hopeless 1 1 1   PHQ - 2 Score 1 1 1   Altered sleeping 0 0 0  Tired, decreased energy 0 0 0  Change in appetite 0 0 0  Feeling bad or failure about yourself  0 0 0  Trouble concentrating 0 0 0  Moving slowly or fidgety/restless 0 0 0  Suicidal thoughts 0 0 0  PHQ-9 Score 1 1 1   Difficult doing work/chores Not difficult at all Not difficult at all -    Alcohol screening:   Office Visit from 07/02/2019 in [facility]  AUDIT-C Score  0       Immunizations and Health Maintenance: Health Maintenance  Topic Date Due  . HIV Screening  Never done  . PAP-Cervical Cytology Screening  Never done  . PAP SMEAR-Modifier  Never done  . TETANUS/TDAP  09/21/2017  . INFLUENZA VACCINE  09/23/2019     Hep C Screening: n/a  STD testing and prevention (HIV/chl/gon/syphilis):  Will do HIV and RPR today, do other STDs with PAP when she returns Intimate partner violence:   Safe, no relationship right now Sexual History/Pain during Intercourse:  none Menstrual History/LMP/Abnormal Bleeding:  Heavy bleeding today, started period yesterday, regular Patient's last menstrual period was 07/01/2019.  Incontinence Symptoms: none  Breast cancer:  Last Mammogram: *see HM list above BRCA gene screening: none   Cervical cancer screening: due for PAP, heavy period will return for PAP  Pt no family hx of cancers - breast, ovarian, uterine, colon:    Grandparent with brian BA  Osteoporosis:   N/A for age  Skin cancer:  Hx of skin CA -  NO Discussed atypical lesions   Colorectal cancer:   Colonoscopy is not indicated for age   Discussed concerning signs and sx of CRC, pt denies melena, hematochezia  Lung cancer:   Low Dose CT  Chest recommended if Age 24-80 years, 30 pack-year currently smoking OR have quit w/in 15years. Patient does not qualify.    Social History   Tobacco Use  . Smoking status: Never Smoker  . Smokeless tobacco: Never Used  Substance Use Topics  . Alcohol use: Never  . Drug use: Never       [facility] Visit from 07/02/2019 in [facility]  AUDIT-C Score  0      Family History  Problem Relation Age of Onset  . Depression Mother   . Diabetes Maternal Grandmother   . Arthritis Maternal Grandmother   . COPD Maternal Grandmother   . Asthma Maternal Grandmother   . Lung cancer Maternal Grandfather   . Pneumonia Maternal Grandfather   . Heart disease Maternal Grandfather   . Diabetes Paternal  Grandmother   . COPD Paternal Grandmother   . Hypertension Paternal Grandmother      Blood pressure/Hypertension: BP Readings from Last 3 Encounters:  07/02/19 122/64  05/15/19 116/78  05/02/19 116/78    Weight/Obesity: Wt Readings from Last 3 Encounters:  07/02/19 134 lb 4.8 oz (60.9 kg)  05/15/19 131 lb (59.4 kg)  05/02/19 127 lb 14.4 oz (58 kg)   BMI Readings from Last 3 Encounters:  07/02/19 24.56 kg/m  05/15/19 23.96 kg/m  05/02/19 24.17 kg/m     Lipids:  No results found for: CHOL No results found for: HDL No results found for: LDLCALC No results found for: TRIG No results found for: CHOLHDL No results found for: LDLDIRECT Based on the results of lipid panel his/her cardiovascular risk factor ( using Crystal Hensley )  in the next 10 years is: The ASCVD Risk score Crystal Hensley DC Jr., et al., Hensley) failed to calculate for the following reasons:   The Hensley ASCVD risk score is only valid for ages 72 to 34 Glucose:  No results found for: GLUCOSE, GLUCAP Hypertension: BP Readings from Last 3 Encounters:  07/02/19 122/64  05/15/19 116/78  05/02/19 116/78   Obesity: Wt Readings from Last 3 Encounters:  07/02/19 134 lb 4.8 oz (60.9 kg)  05/15/19 131 lb (59.4 kg)  05/02/19 127 lb 14.4 oz (58 kg)   BMI Readings from Last 3 Encounters:  07/02/19 24.56 kg/m  05/15/19 23.96 kg/m  05/02/19 24.17 kg/m      Advanced Care Planning:  A voluntary discussion about advance care planning including the explanation and discussion of advance directives.   Discussed health care proxy and Living will, and the patient was able to identify a health care proxy as Crystal Hensley    Patient does not have a living will at present time.   Social History      She        Social History   Socioeconomic History  . Marital status: Single    Spouse name: Not on file  . Number of children: Not on file  . Years of education: Not on file  . Highest education level: Not on file    Occupational History  . Not on file  Tobacco Use  . Smoking status: Never Smoker  . Smokeless tobacco: Never Used  Substance and Sexual Activity  . Alcohol use: Never  . Drug use: Never  . Sexual activity: Yes    Birth control/protection: Pill  Other Topics Concern  . Not on file  Social History Narrative  . Not on file   Social Determinants of Health   Financial Resource Strain:   . Difficulty of Paying  Living Expenses:   Food Insecurity:   . Worried About Charity fundraiser in the Last Year:   . Arboriculturist in the Last Year:   Transportation Needs:   . Film/video editor (Medical):   Marland Kitchen Lack of Transportation (Non-Medical):   Physical Activity:   . Days of Exercise per Week:   . Minutes of Exercise per Session:   Stress:   . Feeling of Stress :   Social Connections:   . Frequency of Communication with Friends and Family:   . Frequency of Social Gatherings with Friends and Family:   . Attends Religious Services:   . Active Member of Clubs or Organizations:   . Attends Archivist Meetings:   Marland Kitchen Marital Status:     Family History        Family History  Problem Relation Age of Onset  . Depression Mother   . Diabetes Maternal Grandmother   . Arthritis Maternal Grandmother   . COPD Maternal Grandmother   . Asthma Maternal Grandmother   . Lung cancer Maternal Grandfather   . Pneumonia Maternal Grandfather   . Heart disease Maternal Grandfather   . Diabetes Paternal Grandmother   . COPD Paternal Grandmother   . Hypertension Paternal Grandmother     Patient Active Problem List   Diagnosis Date Noted  . Anxiety     Past Surgical History:  Procedure Laterality Date  . WISDOM TOOTH EXTRACTION       Current Outpatient Medications:  .  cetirizine (ZYRTEC) 10 MG tablet, Take 1 tablet (10 mg total) by mouth daily., Disp: 30 tablet, Rfl: 11 .  hydrOXYzine (ATARAX/VISTARIL) 10 MG tablet, Take 1-2 tablets (10-20 mg total) by mouth every 8 (eight)  hours as needed for itching (rash)., Disp: 60 tablet, Rfl: 1 .  norethindrone-ethinyl estradiol (JUNEL FE 1/20) 1-20 MG-MCG tablet, Take 1 tablet by mouth daily., Disp: 1 Package, Rfl: 11 .  sertraline (ZOLOFT) 100 MG tablet, Take 100 mg by mouth daily., Disp: , Rfl:   No Known Allergies  Patient Care Team: Crystal Hensley as PCP - General (Family Medicine)  Review of Systems  Constitutional: Negative.  Negative for activity change, appetite change, fatigue and unexpected weight change.  HENT: Negative.   Eyes: Negative.   Respiratory: Negative.  Negative for shortness of breath.   Cardiovascular: Negative.  Negative for chest pain, palpitations and leg swelling.  Gastrointestinal: Negative.  Negative for abdominal pain and blood in stool.  Endocrine: Negative.   Genitourinary: Negative.   Musculoskeletal: Negative.  Negative for arthralgias, gait problem, joint swelling and myalgias.  Skin: Positive for rash. Negative for color change and pallor.  Allergic/Immunologic: Negative.   Neurological: Negative.  Negative for syncope and weakness.  Hematological: Negative.   Psychiatric/Behavioral: Negative.  Negative for confusion, dysphoric mood, self-injury and suicidal ideas. The patient is not nervous/anxious.      I personally reviewed active problem list, medication list, allergies, family history, social history, health maintenance, notes from last encounter, lab results, imaging with the patient/caregiver today.       Objective:   Vitals:  Vitals:   07/02/19 1055  BP: 122/64  Pulse: 88  Resp: 14  Temp: 98.5 F (36.9 C)  SpO2: 99%  Weight: 134 lb 4.8 oz (60.9 kg)  Height: 5' 2"  (1.575 m)    Body mass index is 24.56 kg/m.  Physical Exam Vitals and nursing note reviewed.  Constitutional:      General: She is  not in acute distress.    Appearance: Normal appearance. She is well-developed. She is not ill-appearing, toxic-appearing or diaphoretic.     Interventions:  Face mask in place.  HENT:     Head: Normocephalic and atraumatic.     Right Ear: External ear normal.     Left Ear: External ear normal.  Eyes:     General: Lids are normal. No scleral icterus.       Right eye: No discharge.        Left eye: No discharge.     Conjunctiva/sclera: Conjunctivae normal.  Neck:     Trachea: Phonation normal. No tracheal deviation.  Cardiovascular:     Rate and Rhythm: Normal rate and regular rhythm.     Pulses: Normal pulses.          Radial pulses are 2+ on the right side and 2+ on the left side.       Posterior tibial pulses are 2+ on the right side and 2+ on the left side.     Heart sounds: Normal heart sounds. No murmur. No friction rub. No gallop.   Pulmonary:     Effort: Pulmonary effort is normal. No respiratory distress.     Breath sounds: Normal breath sounds. No stridor. No wheezing, rhonchi or rales.  Chest:     Chest wall: No tenderness.  Abdominal:     General: Bowel sounds are normal. There is no distension.     Palpations: Abdomen is soft.     Tenderness: There is no abdominal tenderness. There is no guarding or rebound.  Musculoskeletal:        General: No deformity. Normal range of motion.     Cervical back: Normal range of motion and neck supple.     Right lower leg: No edema.     Left lower leg: No edema.  Lymphadenopathy:     Cervical: No cervical adenopathy.  Skin:    General: Skin is warm and dry.     Capillary Refill: Capillary refill takes less than 2 seconds.     Coloration: Skin is not jaundiced or pale.     Findings: Rash present.  Neurological:     Mental Status: She is alert and oriented to person, place, and time.     Motor: No abnormal muscle tone.     Gait: Gait normal.  Psychiatric:        Mood and Affect: Mood normal.        Behavior: Behavior is cooperative.       Fall Risk: Fall Risk  07/02/2019 05/15/2019 05/02/2019  Falls in the past year? 0 0 0  Number falls in past yr: 0 0 0  Injury with Fall? 0  0 0    Functional Status Survey: Is the patient deaf or have difficulty hearing?: No Does the patient have difficulty seeing, even when wearing glasses/contacts?: No Does the patient have difficulty concentrating, remembering, or making decisions?: No Does the patient have difficulty walking or climbing stairs?: No Does the patient have difficulty dressing or bathing?: No Does the patient have difficulty doing errands alone such as visiting a doctor's office or shopping?: No   Assessment & Plan:    CPE completed today  . USPSTF grade A and B recommendations reviewed with patient; age-appropriate recommendations, preventive care, screening tests, etc discussed and encouraged; healthy living encouraged; see AVS for patient education given to patient  . Discussed importance of 150 minutes of physical activity weekly, AHA exercise recommendations  given to pt in AVS/handout  . Discussed importance of healthy diet:  eating lean meats and proteins, avoiding trans fats and saturated fats, avoid simple sugars and excessive carbs in diet, eat 6 servings of fruit/vegetables daily and drink plenty of water and avoid sweet beverages.    . Recommended pt to do annual eye exam and routine dental exams/cleanings  . Depression, alcohol, fall screening completed as documented above and per flowsheets  . Reviewed Health Maintenance: Health Maintenance  Topic Date Due  . HIV Screening  Never done  . PAP-Cervical Cytology Screening  Never done  . PAP SMEAR-Modifier  Never done  . TETANUS/TDAP  09/21/2017  . INFLUENZA VACCINE  09/23/2019    . Immunizations: Immunization History  Administered Date(s) Administered  . Hepatitis A 01/01/2011, 12/18/2012  . Hepatitis B Jul [birth year], 1996, 09/10/1996, 02/08/1997  . HiB (PRP-OMP) 10/12/1996, 12/05/1996, 01/29/1997, 10/29/1997  . Hpv 01/01/2011, 12/18/2012, 03/19/2014  . IPV 10/12/1996, 12/05/1996, 07/29/1997, 10/05/2001  . MMR 07/29/1997, 10/05/2001  .  Meningococcal Conjugate 01/01/2011  . Meningococcal Polysaccharide 12/18/2012  . Tdap 09/22/2007  . Varicella 07/29/1997, 09/22/2007     1. Adult general medical exam - COMPLETE METABOLIC PANEL WITH GFR - CBC with Differential/Platelet - HIV Antibody (routine testing w rflx) - RPR  2. Need for Tdap vaccination - Tdap vaccine greater than or equal to 7yo IM  3. High risk sexual behavior, unspecified type- counseled on safe sex practices - HIV Antibody (routine testing w rflx) - RPR  4. Screening examination for STD (sexually transmitted disease) - HIV Antibody (routine testing w rflx) - RPR  5. Encounter for initial prescription of injectable contraceptive- discussed depo shot, SE, change in menses, 13 week f/up, using condoms if not starting injectable with start of menses - they will get shot at pharmacy and return for injection - medroxyPROGESTERone (DEPO-PROVERA) 150 MG/ML injection; Inject 1 mL (150 mg total) into the muscle every 3 (three) months.  Dispense: 1 mL; Refill: 3   Pt due for PAP but started menses yesterday, very heavy bleeding, she wants to defer 2-3 weeks - will do PAP HPV and remaining STD testing then  Pt with chronic rash - eczema? Vs contact dermatitis or sensitive skin, possibly picking? Steroid cream BID up to 7 days on itchy rash, use only over closed skin - not over wounds Continue allergy meds, f/up as needed  Crystal Hensley 07/02/19 11:04 AM  [facility]

## 2019-07-03 ENCOUNTER — Ambulatory Visit (INDEPENDENT_AMBULATORY_CARE_PROVIDER_SITE_OTHER): Payer: Medicaid Other

## 2019-07-03 DIAGNOSIS — Z30013 Encounter for initial prescription of injectable contraceptive: Secondary | ICD-10-CM | POA: Diagnosis not present

## 2019-07-03 LAB — HIV ANTIBODY (ROUTINE TESTING W REFLEX): HIV 1&2 Ab, 4th Generation: NONREACTIVE

## 2019-07-03 LAB — CBC WITH DIFFERENTIAL/PLATELET
Absolute Monocytes: 464 cells/uL (ref 200–950)
Basophils Absolute: 9 cells/uL (ref 0–200)
Basophils Relative: 0.1 %
Eosinophils Absolute: 103 cells/uL (ref 15–500)
Eosinophils Relative: 1.2 %
HCT: 41.9 % (ref 35.0–45.0)
Hemoglobin: 14 g/dL (ref 11.7–15.5)
Lymphs Abs: 2210 cells/uL (ref 850–3900)
MCH: 30.1 pg (ref 27.0–33.0)
MCHC: 33.4 g/dL (ref 32.0–36.0)
MCV: 90.1 fL (ref 80.0–100.0)
MPV: 10 fL (ref 7.5–12.5)
Monocytes Relative: 5.4 %
Neutro Abs: 5814 cells/uL (ref 1500–7800)
Neutrophils Relative %: 67.6 %
Platelets: 226 10*3/uL (ref 140–400)
RBC: 4.65 10*6/uL (ref 3.80–5.10)
RDW: 12.6 % (ref 11.0–15.0)
Total Lymphocyte: 25.7 %
WBC: 8.6 10*3/uL (ref 3.8–10.8)

## 2019-07-03 LAB — COMPLETE METABOLIC PANEL WITH GFR
AG Ratio: 1.3 (calc) (ref 1.0–2.5)
ALT: 10 U/L (ref 6–29)
AST: 14 U/L (ref 10–30)
Albumin: 3.5 g/dL — ABNORMAL LOW (ref 3.6–5.1)
Alkaline phosphatase (APISO): 56 U/L (ref 31–125)
BUN: 8 mg/dL (ref 7–25)
CO2: 28 mmol/L (ref 20–32)
Calcium: 9.1 mg/dL (ref 8.6–10.2)
Chloride: 104 mmol/L (ref 98–110)
Creat: 0.74 mg/dL (ref 0.50–1.10)
GFR, Est African American: 133 mL/min/{1.73_m2} (ref >=60)
GFR, Est Non African American: 115 mL/min/{1.73_m2} (ref >=60)
Globulin: 2.6 g/dL (calc) (ref 1.9–3.7)
Glucose, Bld: 90 mg/dL (ref 65–99)
Potassium: 4 mmol/L (ref 3.5–5.3)
Sodium: 141 mmol/L (ref 135–146)
Total Bilirubin: 0.3 mg/dL (ref 0.2–1.2)
Total Protein: 6.1 g/dL (ref 6.1–8.1)

## 2019-07-03 LAB — RPR: RPR Ser Ql: NONREACTIVE

## 2019-07-03 MED ORDER — MEDROXYPROGESTERONE ACETATE 150 MG/ML IM SUSP
150.0000 mg | Freq: Once | INTRAMUSCULAR | Status: AC
  Administered 2019-07-03: 150 mg via INTRAMUSCULAR

## 2019-07-04 ENCOUNTER — Telehealth: Payer: Self-pay

## 2019-07-04 NOTE — Telephone Encounter (Signed)
-----   Message from Danelle Berry, [place] sent at 07/03/2019  7:48 PM EDT ----- Please notify pt of negative labwork

## 2019-07-05 DIAGNOSIS — F411 Generalized anxiety disorder: Secondary | ICD-10-CM | POA: Diagnosis not present

## 2019-07-12 DIAGNOSIS — F411 Generalized anxiety disorder: Secondary | ICD-10-CM | POA: Diagnosis not present

## 2019-07-18 ENCOUNTER — Ambulatory Visit: Payer: Medicaid Other | Admitting: Family Medicine

## 2019-07-18 ENCOUNTER — Other Ambulatory Visit: Payer: Self-pay

## 2019-07-18 ENCOUNTER — Encounter: Payer: Self-pay | Admitting: Family Medicine

## 2019-07-18 ENCOUNTER — Other Ambulatory Visit (HOSPITAL_COMMUNITY)
Admission: RE | Admit: 2019-07-18 | Discharge: 2019-07-18 | Disposition: A | Discharge status: Home / Self Care | Payer: Medicaid Other | Source: Ambulatory Visit | Attending: Family Medicine | Admitting: Family Medicine

## 2019-07-18 VITALS — BP 110/68 | HR 88 | Temp 97.3°F | Resp 18 | Ht 62.0 in | Wt 132.2 lb

## 2019-07-18 DIAGNOSIS — Z124 Encounter for screening for malignant neoplasm of cervix: Secondary | ICD-10-CM | POA: Insufficient documentation

## 2019-07-18 DIAGNOSIS — Z113 Encounter for screening for infections with a predominantly sexual mode of transmission: Secondary | ICD-10-CM

## 2019-07-18 DIAGNOSIS — F419 Anxiety disorder, unspecified: Secondary | ICD-10-CM | POA: Diagnosis not present

## 2019-07-18 DIAGNOSIS — Z3042 Encounter for surveillance of injectable contraceptive: Secondary | ICD-10-CM | POA: Diagnosis not present

## 2019-07-18 DIAGNOSIS — Z9109 Other allergy status, other than to drugs and biological substances: Secondary | ICD-10-CM | POA: Diagnosis not present

## 2019-07-18 MED ORDER — CETIRIZINE HCL 10 MG PO TABS: 10.0000 mg | ORAL_TABLET | Freq: Every day | ORAL | 11 refills | Status: DC

## 2019-07-18 NOTE — Progress Notes (Signed)
Name: Crystal Hensley   MRN: [id number]    DOB: November 27, 1995   Date:07/18/2019       Progress Note  Subjective  Chief Complaint  Chief Complaint  Patient presents with  . Gynecologic Exam    HPI  Anxiety: sees psychiatrist at [facility], taking zoloft for many years and is doing well on medication. No symptoms of depression. She skips medications, but [place]  ( her aunt is trying to help her with medication management )  Seasonal allergic rhinitis: she ran out of medication, she states having some nasal congestion, rhinorrhea and itchy eyes   Intellectual disability : she finished high school with [employer] program. She had a job for training but not currently working.   STI screen: not currently sexually active, but is on Depo, tolerating it well, no vaginal discharge or pruritis   Patient Active Problem List   Diagnosis Date Noted  . Anxiety   . Seasonal allergic rhinitis 06/28/2008  . Disability, developmental Sep 17, 1996    Past Surgical History:  Procedure Laterality Date  . WISDOM TOOTH EXTRACTION      Family History  Problem Relation Age of Onset  . Depression Mother   . Diabetes Maternal Grandmother   . Arthritis Maternal Grandmother   . COPD Maternal Grandmother   . Asthma Maternal Grandmother   . Lung cancer Maternal Grandfather   . Pneumonia Maternal Grandfather   . Heart disease Maternal Grandfather   . Diabetes Paternal Grandmother   . COPD Paternal Grandmother   . Hypertension Paternal Grandmother     Social History   Tobacco Use  . Smoking status: Never Smoker  . Smokeless tobacco: Never Used  Substance Use Topics  . Alcohol use: Never     Current Outpatient Medications:  .  medroxyPROGESTERone (DEPO-PROVERA) 150 MG/ML injection, Inject 1 mL (150 mg total) into the muscle every 3 (three) months., Disp: 1 mL, Rfl: 3 .  sertraline (ZOLOFT) 100 MG tablet, Take 100 mg by mouth daily., Disp: , Rfl:  .  cetirizine (ZYRTEC) 10 MG tablet, Take 1 tablet (10 mg total) by  mouth daily., Disp: 30 tablet, Rfl: 11  No Known Allergies  I personally reviewed active problem list, medication list, allergies, family history, social history, health maintenance with the patient/caregiver today.   ROS  Constitutional: Negative for fever or weight change.  Respiratory: Negative for cough and shortness of breath.   Cardiovascular: Negative for chest pain or palpitations.  Gastrointestinal: Negative for abdominal pain, no bowel changes.  Musculoskeletal: Negative for gait problem or joint swelling.  Skin: Negative for rash.  Neurological: Negative for dizziness or headache.  No other specific complaints in a complete review of systems (except as listed in HPI above).  Objective  Vitals:   07/18/19 1347  BP: 110/68  Pulse: 88  Resp: 18  Temp: (!) 97.3 F (36.3 C)  TempSrc: Temporal  SpO2: 99%  Weight: 132 lb 3.2 oz (60 kg)  Height: 5\' 2"  (1.575 m)    Body mass index is 24.18 kg/m.  Physical Exam  Constitutional: Patient appears well-developed and well-nourished.No distress.  HEENT: head atraumatic, normocephalic, pupils equal and reactive to light,  neck supple Cardiovascular: Normal rate, regular rhythm and normal heart sounds.  No murmur heard. No BLE edema. Pulmonary/Chest: Effort normal and breath sounds normal. No respiratory distress. Abdominal: Soft.  There is no tenderness. GYN: normal external exam, normal bimanual exam, cervix normal and pap smear collected Breast exam: normal  Psychiatric: Patient was anxious but  relaxed after pelvic exam was done. behavior is normal. Judgment and thought content normal.  Recent Results (from the past 2160 hour(s))  COMPLETE METABOLIC PANEL WITH GFR     Status: Abnormal   Collection Time: 07/02/19 11:44 AM  Result Value Ref Range   Glucose, Bld 90 65 - 99 mg/dL    Comment: .            Fasting reference interval .    BUN 8 7 - 25 mg/dL   Creat 7.82 9.56 - 2.13 mg/dL   GFR, Est Non African American  115 > OR = 60 mL/min/1.42m2   GFR, Est African American 133 > OR = 60 mL/min/1.55m2   BUN/Creatinine Ratio NOT APPLICABLE 6 - 22 (calc)   Sodium 141 135 - 146 mmol/L   Potassium 4.0 3.5 - 5.3 mmol/L   Chloride 104 98 - 110 mmol/L   CO2 28 20 - 32 mmol/L   Calcium 9.1 8.6 - 10.2 mg/dL   Total Protein 6.1 6.1 - 8.1 g/dL   Albumin 3.5 (L) 3.6 - 5.1 g/dL   Globulin 2.6 1.9 - 3.7 g/dL (calc)   AG Ratio 1.3 1.0 - 2.5 (calc)   Total Bilirubin 0.3 0.2 - 1.2 mg/dL   Alkaline phosphatase (APISO) 56 31 - 125 U/L   AST 14 10 - 30 U/L   ALT 10 6 - 29 U/L  CBC with Differential/Platelet     Status: None   Collection Time: 07/02/19 11:44 AM  Result Value Ref Range   WBC 8.6 3.8 - 10.8 Thousand/uL   RBC 4.65 3.80 - 5.10 Million/uL   Hemoglobin 14.0 11.7 - 15.5 g/dL   HCT 08.6 57.8 - 46.9 %   MCV 90.1 80.0 - 100.0 fL   MCH 30.1 27.0 - 33.0 pg   MCHC 33.4 32.0 - 36.0 g/dL   RDW 62.9 52.8 - 41.3 %   Platelets 226 140 - 400 Thousand/uL   MPV 10.0 7.5 - 12.5 fL   Neutro Abs 5,814 1,500 - 7,800 cells/uL   Lymphs Abs 2,210 850 - 3,900 cells/uL   Absolute Monocytes 464 200 - 950 cells/uL   Eosinophils Absolute 103 15 - 500 cells/uL   Basophils Absolute 9 0 - 200 cells/uL   Neutrophils Relative % 67.6 %   Total Lymphocyte 25.7 %   Monocytes Relative 5.4 %   Eosinophils Relative 1.2 %   Basophils Relative 0.1 %  HIV Antibody (routine testing w rflx)     Status: None   Collection Time: 07/02/19 11:44 AM  Result Value Ref Range   HIV 1&2 Ab, 4th Generation NON-REACTIVE NON-REACTI    Comment: HIV-1 antigen and HIV-1/HIV-2 antibodies were not detected. There is no laboratory evidence of HIV infection. Marland Kitchen PLEASE NOTE: This information has been disclosed to you from records whose confidentiality may be protected by state law.  If your state requires such protection, then the state law prohibits you from making any further disclosure of the information without the specific written consent of the  person to whom it pertains, or as otherwise permitted by law. A general authorization for the release of medical or other information is NOT sufficient for this purpose. . For additional information please refer to http://education.questdiagnostics.com/faq/FAQ106 (This link is being provided for informational/ educational purposes only.) . Marland Kitchen The performance of this assay has not been clinically validated in patients less than 13 years old. .   RPR     Status: None   Collection Time: 07/02/19  11:44 AM  Result Value Ref Range   RPR Ser Ql NON-REACTIVE NON-REACTI      PHQ2/9: Depression screen [facility] 2/9 07/18/2019 07/02/2019 05/15/2019 05/02/2019  Decreased Interest 0 0 0 0  Down, Depressed, Hopeless 0 1 1 1   PHQ - 2 Score 0 1 1 1   Altered sleeping 0 0 0 0  Tired, decreased energy 0 0 0 0  Change in appetite 0 0 0 0  Feeling bad or failure about yourself  0 0 0 0  Trouble concentrating 0 0 0 0  Moving slowly or fidgety/restless 0 0 0 0  Suicidal thoughts 0 0 0 0  PHQ-9 Score 0 1 1 1   Difficult doing work/chores Not difficult at all Not difficult at all Not difficult at all -    phq 9 is negative   Fall Risk: Fall Risk  07/18/2019 07/02/2019 05/15/2019 05/02/2019  Falls in the past year? 0 0 0 0  Number falls in past yr: 0 0 0 0  Injury with Fall? 0 0 0 0  Follow up Falls evaluation completed - - -       Assessment & Plan  1. Encounter for surveillance of injectable contraceptive  Doing well   2. Cervical cancer screening  - Cytology - PAP  3. Routine screening for STI (sexually transmitted infection)  - Cytology - PAP  4. Anxiety  Keep follow up with psychiatrist  5. Multiple environmental allergies  - cetirizine (ZYRTEC) 10 MG tablet; Take 1 tablet (10 mg total) by mouth daily.  Dispense: 30 tablet; Refill: 11

## 2019-07-19 DIAGNOSIS — F411 Generalized anxiety disorder: Secondary | ICD-10-CM | POA: Diagnosis not present

## 2019-07-20 LAB — CYTOLOGY - PAP
Chlamydia: NEGATIVE
Comment: NEGATIVE
Comment: NORMAL
Diagnosis: NEGATIVE
Neisseria Gonorrhea: NEGATIVE

## 2019-07-24 DIAGNOSIS — F411 Generalized anxiety disorder: Secondary | ICD-10-CM | POA: Diagnosis not present

## 2019-07-26 DIAGNOSIS — F411 Generalized anxiety disorder: Secondary | ICD-10-CM | POA: Diagnosis not present

## 2019-07-27 ENCOUNTER — Ambulatory Visit: Payer: Medicaid Other | Admitting: Family Medicine

## 2019-08-02 DIAGNOSIS — F411 Generalized anxiety disorder: Secondary | ICD-10-CM | POA: Diagnosis not present

## 2019-08-03 ENCOUNTER — Telehealth: Payer: Self-pay

## 2019-08-03 NOTE — Telephone Encounter (Signed)
Copied from CRM [id number]. Topic: General - Inquiry >> Aug 03, 2019 10:53 AM Leafy Ro wrote: Reason for PPI:RJJOA [place]latch with lawyer firm [employer]. Kirt Boys is the court appointed guardian ad litem and would like leisa tapia to return her call concerning the patient competency

## 2019-08-07 NOTE — Telephone Encounter (Signed)
Crystal Hensley [phone]

## 2019-08-09 DIAGNOSIS — F411 Generalized anxiety disorder: Secondary | ICD-10-CM | POA: Diagnosis not present

## 2019-08-16 DIAGNOSIS — F411 Generalized anxiety disorder: Secondary | ICD-10-CM | POA: Diagnosis not present

## 2019-08-23 DIAGNOSIS — F840 Autistic disorder: Secondary | ICD-10-CM | POA: Diagnosis not present

## 2019-08-28 ENCOUNTER — Encounter: Payer: Self-pay | Admitting: Family Medicine

## 2019-08-30 DIAGNOSIS — F840 Autistic disorder: Secondary | ICD-10-CM | POA: Diagnosis not present

## 2019-09-06 DIAGNOSIS — F840 Autistic disorder: Secondary | ICD-10-CM | POA: Diagnosis not present

## 2019-09-09 ENCOUNTER — Encounter: Payer: Self-pay | Admitting: Family Medicine

## 2019-09-13 DIAGNOSIS — F840 Autistic disorder: Secondary | ICD-10-CM | POA: Diagnosis not present

## 2019-09-20 DIAGNOSIS — F840 Autistic disorder: Secondary | ICD-10-CM | POA: Diagnosis not present

## 2019-09-25 ENCOUNTER — Other Ambulatory Visit: Payer: Self-pay | Admitting: Family Medicine

## 2019-09-27 DIAGNOSIS — F840 Autistic disorder: Secondary | ICD-10-CM | POA: Diagnosis not present

## 2019-10-01 ENCOUNTER — Ambulatory Visit: Payer: Medicaid Other | Admitting: Family Medicine

## 2019-10-01 ENCOUNTER — Encounter: Payer: Self-pay | Admitting: Family Medicine

## 2019-10-02 ENCOUNTER — Telehealth: Payer: Self-pay | Admitting: *Deleted

## 2019-10-02 ENCOUNTER — Encounter: Payer: Self-pay | Admitting: Family Medicine

## 2019-10-02 ENCOUNTER — Other Ambulatory Visit: Payer: Self-pay

## 2019-10-02 ENCOUNTER — Ambulatory Visit: Payer: Medicaid Other | Admitting: Family Medicine

## 2019-10-02 VITALS — BP 110/68 | HR 90 | Temp 98.2°F | Resp 18 | Ht 62.0 in | Wt 140.9 lb

## 2019-10-02 DIAGNOSIS — Z593 Problems related to living in residential institution: Secondary | ICD-10-CM

## 2019-10-02 DIAGNOSIS — Z658 Other specified problems related to psychosocial circumstances: Secondary | ICD-10-CM | POA: Diagnosis not present

## 2019-10-02 DIAGNOSIS — Z111 Encounter for screening for respiratory tuberculosis: Secondary | ICD-10-CM

## 2019-10-02 DIAGNOSIS — R635 Abnormal weight gain: Secondary | ICD-10-CM

## 2019-10-02 DIAGNOSIS — Z3042 Encounter for surveillance of injectable contraceptive: Secondary | ICD-10-CM | POA: Diagnosis not present

## 2019-10-02 DIAGNOSIS — F89 Unspecified disorder of psychological development: Secondary | ICD-10-CM | POA: Diagnosis not present

## 2019-10-02 DIAGNOSIS — J302 Other seasonal allergic rhinitis: Secondary | ICD-10-CM

## 2019-10-02 DIAGNOSIS — L209 Atopic dermatitis, unspecified: Secondary | ICD-10-CM

## 2019-10-02 MED ORDER — MEDROXYPROGESTERONE ACETATE 150 MG/ML IM SUSP
150.0000 mg | Freq: Once | INTRAMUSCULAR | Status: AC
  Administered 2019-10-02 (×3): 150 mg via INTRAMUSCULAR

## 2019-10-02 NOTE — Chronic Care Management (AMB) (Signed)
°  Care Management   Outreach Note  10/02/2019 Name: SHEKETA ENDE MRN: [id number] DOB: 17-May-1995  [name] is a 24 y.o. year old female who is a primary care patient of Danelle Berry, [place]. I reached out to [name] by phone today in response to a referral sent by Ms. Danella Maiers [place]'s PCP, Danelle Berry, PA-C     An unsuccessful telephone outreach was attempted today. The patient was referred to the case management team for assistance with care management and care coordination.   Follow Up Plan: A HIPPA compliant phone message was left for the patient providing contact information and requesting a return call. The care management team will reach out to the patient again over the next 7 days. If patient returns call to provider office, please advise to call Embedded Care Management Care Guide Gwenevere Ghazi at [phone].  Gwenevere Ghazi  Care Guide, Embedded Care Coordination [facility]  [place], [place] [address] Direct Dial: [phone] [email or web address] Website: [email or web address]

## 2019-10-02 NOTE — Progress Notes (Signed)
Name: Crystal Hensley   MRN: [id number]    DOB: 1995-08-22   Date:10/02/2019       Progress Note  Chief Complaint  Patient presents with  . Paper work    FL2/tbskin test  . Contraception    depo injection     Subjective:   Crystal Hensley is a 24 y.o. female, presents to clinic for FL2, TB testing and for her depo shot  Decreased periods since starting depo Some weight gain. Wt Readings from Last 5 Encounters:  10/02/19 140 lb 14.4 oz (63.9 kg)  07/18/19 132 lb 3.2 oz (60 kg)  07/02/19 134 lb 4.8 oz (60.9 kg)  05/15/19 131 lb (59.4 kg)  05/02/19 127 lb 14.4 oz (58 kg)   BMI Readings from Last 5 Encounters:  10/02/19 25.77 kg/m  07/18/19 24.18 kg/m  07/02/19 24.56 kg/m  05/15/19 23.96 kg/m  05/02/19 24.17 kg/m   Needs TB screening for adult group home FL2  FL2 paperwork not brought in, though she does have a pending placement - pt's aunt and guardian sent in and brought in papers last week from the court - she has been working on finding her a group home to live at.  She has contact name and number with her - papers from last week, not yet reviewed and scanned into chart.  Pt is still going to [facility] - doing well with mood and continues to take zoloft  She stopped going to donate plasma  Her rash and itching has improved with better med compliance to antihistamines.     Current Outpatient Medications:  .  cetirizine (ZYRTEC) 10 MG tablet, Take 1 tablet (10 mg total) by mouth daily., Disp: 30 tablet, Rfl: 11 .  hydrOXYzine (ATARAX/VISTARIL) 10 MG tablet, Take by mouth., Disp: , Rfl:  .  medroxyPROGESTERone (DEPO-PROVERA) 150 MG/ML injection, Inject 1 mL (150 mg total) into the muscle every 3 (three) months., Disp: 1 mL, Rfl: 3 .  sertraline (ZOLOFT) 100 MG tablet, Take 100 mg by mouth daily., Disp: , Rfl:   Patient Active Problem List   Diagnosis Date Noted  . Anxiety   . Seasonal allergic rhinitis 06/28/2008  . Disability, developmental 1996/03/26    Past  Surgical History:  Procedure Laterality Date  . WISDOM TOOTH EXTRACTION      Family History  Problem Relation Age of Onset  . Depression Mother   . Diabetes Maternal Grandmother   . Arthritis Maternal Grandmother   . COPD Maternal Grandmother   . Asthma Maternal Grandmother   . Lung cancer Maternal Grandfather   . Pneumonia Maternal Grandfather   . Heart disease Maternal Grandfather   . Diabetes Paternal Grandmother   . COPD Paternal Grandmother   . Hypertension Paternal Grandmother     Social History   Tobacco Use  . Smoking status: Never Smoker  . Smokeless tobacco: Never Used  Vaping Use  . Vaping Use: Never used  Substance Use Topics  . Alcohol use: Never  . Drug use: Never     No Known Allergies  Health Maintenance  Topic Date Due  . Hepatitis C Screening  Never done  . INFLUENZA VACCINE  09/23/2019  . PAP-Cervical Cytology Screening  07/18/2022  . PAP SMEAR-Modifier  07/18/2022  . TETANUS/TDAP  07/01/2029  . COVID-19 Vaccine  Completed  . HIV Screening  Completed    Chart Review Today: I personally reviewed active problem list, medication list, allergies, family history, social history, health maintenance, notes from last  encounter, lab results, imaging with the patient/caregiver today.   Review of Systems  10 Systems reviewed and are negative for acute change except as noted in the HPI.  Objective:   Vitals:   10/02/19 1142  BP: 110/68  Pulse: 90  Resp: 18  Temp: 98.2 F (36.8 C)  TempSrc: Temporal  SpO2: 99%  Weight: 140 lb 14.4 oz (63.9 kg)  Height: 5\' 2"  (1.575 m)    Body mass index is 25.77 kg/m.  Physical Exam Vitals and nursing note reviewed.  Constitutional:      General: She is not in acute distress.    Appearance: Normal appearance. She is well-developed. She is not ill-appearing, toxic-appearing or diaphoretic.     Interventions: Face mask in place.  HENT:     Head: Normocephalic and atraumatic.     Right Ear: External ear  normal.     Left Ear: External ear normal.  Eyes:     General: Lids are normal. No scleral icterus.       Right eye: No discharge.        Left eye: No discharge.     Conjunctiva/sclera: Conjunctivae normal.  Neck:     Trachea: Phonation normal. No tracheal deviation.  Cardiovascular:     Rate and Rhythm: Normal rate and regular rhythm.     Pulses: Normal pulses.          Radial pulses are 2+ on the right side and 2+ on the left side.       Posterior tibial pulses are 2+ on the right side and 2+ on the left side.     Heart sounds: Normal heart sounds. No murmur heard.  No friction rub. No gallop.   Pulmonary:     Effort: Pulmonary effort is normal. No respiratory distress.     Breath sounds: Normal breath sounds. No stridor. No wheezing, rhonchi or rales.  Chest:     Chest wall: No tenderness.  Abdominal:     General: Bowel sounds are normal. There is no distension.     Palpations: Abdomen is soft.     Tenderness: There is no abdominal tenderness. There is no guarding or rebound.  Musculoskeletal:        General: No deformity. Normal range of motion.     Cervical back: Normal range of motion and neck supple.     Right lower leg: No edema.     Left lower leg: No edema.  Lymphadenopathy:     Cervical: No cervical adenopathy.  Skin:    General: Skin is warm and dry.     Coloration: Skin is not jaundiced or pale.     Findings: No rash.  Neurological:     Mental Status: She is alert. Mental status is at baseline.     Motor: No abnormal muscle tone.     Gait: Gait normal.  Psychiatric:        Behavior: Behavior is cooperative.         Assessment & Plan:     ICD-10-CM   1. Disability, developmental  F89 Ambulatory referral to Chronic Care Management Services   her aunt is her appointed guardian - reviewed documentation - will complete FL2 paperwork when recieved  2. Encounter for surveillance of injectable contraceptive  Z30.42 medroxyPROGESTERone (DEPO-PROVERA) injection  150 mg   some weight gain, otherwise lighter periods which pt likes, will have her come again in 3 months to monitor weight  3. Unresolved independence-dependence conflict  Z65.8 Ambulatory referral  to Chronic Care Management Services  4. Screening-pulmonary TB  Z11.1 QuantiFERON-TB Gold Plus   no sx, no suspected exposure- quantiferon gold labs today - will get pt's aunt copy of results for entering adult group home  5. Weight gain  R63.5    SE of depo?  they had been increasing food intake and protein due to what they thought was an abnormal lab - reviewed labs, no protein calorie deficit   6. Lives in group home  Z59.3    fl2 paperwork for getting pt a spot in adult group home  7. Seasonal allergic rhinitis, unspecified trigger  J30.2    improved sx, continue antihistamines  8. Atopic dermatitis, unspecified type  L20.9    appearance of skin and rash much improved, continue antihistmines and hydration, less rash, less excoriations   FL2 paperwork completed after visit - copy scanned to chart - and original given to pt's aunt.    No follow-ups on file.   Danelle Berry, PA-C 10/02/19 12:02 PM

## 2019-10-03 ENCOUNTER — Ambulatory Visit: Payer: Medicaid Other | Admitting: *Deleted

## 2019-10-03 DIAGNOSIS — F89 Unspecified disorder of psychological development: Secondary | ICD-10-CM

## 2019-10-03 DIAGNOSIS — Z658 Other specified problems related to psychosocial circumstances: Secondary | ICD-10-CM

## 2019-10-03 NOTE — Chronic Care Management (AMB) (Signed)
  Care Management   Note  10/03/2019 Name: SHEVETTE BESS MRN: [id number] DOB: 10/25/1995  [name] is a 24 y.o. year old female who is a primary care patient of Danelle Berry, [place]. I reached out to [name] by phone today in response to a referral sent by Ms. Danella Maiers [place]'s health plan.    Ms. Fritzler was given information about care management services today including:  1. Care management services include personalized support from designated clinical staff supervised by her physician, including individualized plan of care and coordination with other care providers 2. 24/7 contact phone numbers for assistance for urgent and routine care needs. 3. The patient may stop care management services at any time by phone call to the office staff.  Patient  Aunt Caren Hazy  verbally agreed to assistance and services provided by embedded care coordination/care management team today.  Follow up plan: Telephone appointment with care management team member scheduled for: 10/03/2019  [facility] Guide, Embedded Care Coordination [facility]  [place], [place] [address] Direct Dial: [phone] [email or web address]: [email or web address]

## 2019-10-03 NOTE — Chronic Care Management (AMB) (Signed)
  Chronic Care Management   Social Work Note  10/03/2019 Name: Crystal Hensley MRN: [id number] DOB: April 28, 1995  Crystal Hensley is a 24 y.o. year old female who sees Danelle Berry, [place] for primary care. The CCM team was consulted for assistance with Level of Care Concerns.   Phone call to patient's gaurdian Caren Hazy to follow up on level of care needs for patient. Per patient's legal guardian patient's group home placement has already been identified and they just need a Fl2, patient's TB test and Depo  shot. Patient will be going to [facility] group home on 10/24/19. Per patient's legal guardian she has received call from the provider's office stating that the  Fl2 has been completed, she is on her way to the providers office  to pick it up. No additional needs identified at this time   SDOH (Social Determinants of Health) assessments performed: No     Outpatient Encounter Medications as of 10/03/2019  Medication Sig  . cetirizine (ZYRTEC) 10 MG tablet Take 1 tablet (10 mg total) by mouth daily.  . hydrOXYzine (ATARAX/VISTARIL) 10 MG tablet Take by mouth.  . medroxyPROGESTERone (DEPO-PROVERA) 150 MG/ML injection Inject 1 mL (150 mg total) into the muscle every 3 (three) months.  . sertraline (ZOLOFT) 100 MG tablet Take 100 mg by mouth daily.   No facility-administered encounter medications on file as of 10/03/2019.    Goals Addressed   None     Follow Up Plan: Client will pick up Fl2 from the providers office for group home placement at [facility] group on on 10/24/19   This social worker will make a follow up call within 7-10 days to continue to assess for any additional assistance needed with group home placement.  Verna Czech, LCSW [facility] Care Management [phone]

## 2019-10-04 ENCOUNTER — Ambulatory Visit: Payer: Self-pay | Admitting: *Deleted

## 2019-10-04 ENCOUNTER — Telehealth: Payer: Self-pay | Admitting: *Deleted

## 2019-10-04 DIAGNOSIS — Z658 Other specified problems related to psychosocial circumstances: Secondary | ICD-10-CM

## 2019-10-04 DIAGNOSIS — F89 Unspecified disorder of psychological development: Secondary | ICD-10-CM

## 2019-10-04 DIAGNOSIS — F840 Autistic disorder: Secondary | ICD-10-CM | POA: Diagnosis not present

## 2019-10-04 NOTE — Patient Instructions (Signed)
Thank you allowing the Chronic Care Management Team to be a part of your care! It was a pleasure speaking with you today!  1. Please call this Child psychotherapist with questions regarding patient's placement needs  CCM (Chronic Care Management) Team   Juanell Fairly RN, BSN Nurse Care Coordinator  [phone]  Aubree Doody, LCSW Clinical Social Worker [phone]  Goals Addressed              This Visit's Progress     "I need a Fl2 for placement for patient" (pt-stated)        CARE PLAN ENTRY (see longitudinal plan of care for additional care plan information)  Current Barriers:   Level of care concerns  Clinical Social Work Clinical Goal(s):   Over the next 30 days, patient will work with this Child psychotherapist and provider's office to address needs related to level of care concerns  Interventions:  Patient interviewed and appropriate assessments performed  Patient's legal guardian states that she has a placement plan for patient and needs the Fl2 signed by her provider  Patient to be going to [facility] group home and should be placed by 10/24/19  Per patient's legal guardian, patient has had her TB test and Depo shot, just needs signed FL2  Discussed plan to follow up with patient's provider's office on Monday regarding the Fl2-message sent to CMA regarding status of FL2  Discussed plans with patient for ongoing care management follow up and provided patient with direct contact information for care management team  Patient Self Care Activities:   Attends all scheduled provider appointments  Unable to perform ADLs independently  Unable to perform IADLs independently  Initial goal documentation         The patient verbalized understanding of instructions provided today and declined a print copy of patient instruction materials.   Telephone follow up appointment with care management team member scheduled for:10/08/19

## 2019-10-04 NOTE — Chronic Care Management (AMB) (Signed)
  Care Management    Clinical Social Work Follow Up Note  10/04/2019 Name: Crystal Hensley MRN: [id number] DOB: 11/27/1995  Crystal Hensley is a 24 y.o. year old female who is a primary care patient of Danelle Berry, [place]. The CCM team was consulted for assistance with Level of Care Concerns.   Review of patient status, including review of consultants reports, other relevant assessments, and collaboration with appropriate care team members and the patient's provider was performed as part of comprehensive patient evaluation and provision of chronic care management services.    SDOH (Social Determinants of Health) assessments performed: No    Outpatient Encounter Medications as of 10/04/2019  Medication Sig  . cetirizine (ZYRTEC) 10 MG tablet Take 1 tablet (10 mg total) by mouth daily.  . hydrOXYzine (ATARAX/VISTARIL) 10 MG tablet Take by mouth.  . medroxyPROGESTERone (DEPO-PROVERA) 150 MG/ML injection Inject 1 mL (150 mg total) into the muscle every 3 (three) months.  . sertraline (ZOLOFT) 100 MG tablet Take 100 mg by mouth daily.   No facility-administered encounter medications on file as of 10/04/2019.     Goals Addressed              This Visit's Progress   .  "I need a Fl2 for placement for patient" (pt-stated)        CARE PLAN ENTRY (see longitudinal plan of care for additional care plan information)  Current Barriers:  . Level of care concerns  Clinical Social Work Clinical Goal(s):  Marland Kitchen Over the next 30 days, patient will work with this Child psychotherapist and provider's office to address needs related to level of care concerns  Interventions: . Patient interviewed and appropriate assessments performed . Patient's legal guardian states that she has a placement plan for patient and needs the Fl2 signed by her provider . Patient to be going to [facility] group home and should be placed by 10/24/19 . Per patient's legal guardian, patient has had her TB test and Depo shot, just needs  signed FL2 . Discussed plan to follow up with patient's provider's office on Monday regarding the Fl2-message sent to CMA regarding status of FL2 . Discussed plans with patient for ongoing care management follow up and provided patient with direct contact information for care management team  Patient Self Care Activities:  . Attends all scheduled provider appointments . Unable to perform ADLs independently . Unable to perform IADLs independently  Initial goal documentation         Follow Up Plan: Appointment scheduled for SW follow up with client by phone on:  10/08/19  Verna Czech, LCSW Clinical Social Worker  [facility] Care Management [phone]

## 2019-10-05 ENCOUNTER — Encounter: Payer: Self-pay | Admitting: Family Medicine

## 2019-10-05 LAB — QUANTIFERON-TB GOLD PLUS
Mitogen-NIL: 7.73 IU/mL
NIL: 0.15 IU/mL
QuantiFERON-TB Gold Plus: NEGATIVE
TB1-NIL: 0 IU/mL
TB2-NIL: 0 IU/mL

## 2019-10-08 ENCOUNTER — Ambulatory Visit: Payer: Self-pay | Admitting: *Deleted

## 2019-10-08 DIAGNOSIS — F89 Unspecified disorder of psychological development: Secondary | ICD-10-CM

## 2019-10-08 DIAGNOSIS — Z658 Other specified problems related to psychosocial circumstances: Secondary | ICD-10-CM

## 2019-10-08 NOTE — Patient Instructions (Signed)
Thank you allowing the Chronic Care Management Team to be a part of your care! It was a pleasure speaking with you today!  1. Please call this social worker with any additional questions or concerns regarding patient's level of care needs  CCM (Chronic Care Management) Team   Juanell Fairly RN, BSN Nurse Care Coordinator  [phone]  Bradleigh Sonnen [address], LCSW Clinical Social Worker [phone]  Goals Addressed              This Visit's Progress   .  "I need a Fl2 for placement for patient" (pt-stated)        CARE PLAN ENTRY (see longitudinal plan of care for additional care plan information)  Current Barriers:  . Level of care concerns  Clinical Social Work Clinical Goal(s):  Marland Kitchen Over the next 30 days, patient will work with this Child psychotherapist and provider's office to address needs related to level of care concerns  Interventions: . Patient's legal guardian states that she has a placement plan for patient and needs the Fl2 signed by her provider . Patient to be going to [facility] group home and should be placed by 10/24/19 . Per patient's legal guardian, patient has had her TB test and Depo shot, just needs signed FL2 . Fl2 completed by provider's office and signed-collaboration phone call to the [facility] group home-fax number received-Fl2 faxed over for planned placement date of 10/24/19 . Discussed plans with patient for ongoing care management follow up if needed and provided patient with direct contact information for care management team  Patient Self Care Activities:  . Attends all scheduled provider appointments . Unable to perform ADLs independently . Unable to perform IADLs independently  Please see past updates related to this goal by clicking on the "Past Updates" button in the selected goal          The patient verbalized understanding of instructions provided today and declined a print copy of patient instruction materials.   No further follow up  required: Fl2 faxed to the group home for placement planned for 10/24/19

## 2019-10-08 NOTE — Chronic Care Management (AMB) (Signed)
   Care Management    Clinical Social Work Follow Up Note  10/08/2019 Name: Crystal Hensley MRN: [id number] DOB: [birth year]  Crystal Hensley is a 24 y.o. year old female who is a primary care patient of Danelle Berry, [place]. The CCM team was consulted for assistance with Level of Care Concerns.   Review of patient status, including review of consultants reports, other relevant assessments, and collaboration with appropriate care team members and the patient's provider was performed as part of comprehensive patient evaluation and provision of chronic care management services.    SDOH (Social Determinants of Health) assessments performed: No    Outpatient Encounter Medications as of 10/08/2019  Medication Sig  . cetirizine (ZYRTEC) 10 MG tablet Take 1 tablet (10 mg total) by mouth daily.  . hydrOXYzine (ATARAX/VISTARIL) 10 MG tablet Take by mouth.  . medroxyPROGESTERone (DEPO-PROVERA) 150 MG/ML injection Inject 1 mL (150 mg total) into the muscle every 3 (three) months.  . sertraline (ZOLOFT) 100 MG tablet Take 100 mg by mouth daily.   No facility-administered encounter medications on file as of 10/08/2019.     Goals Addressed              This Visit's Progress   .  "I need a Fl2 for placement for patient" (pt-stated)        CARE PLAN ENTRY (see longitudinal plan of care for additional care plan information)  Current Barriers:  . Level of care concerns  Clinical Social Work Clinical Goal(s):  Marland Kitchen Over the next 30 days, patient will work with this Child psychotherapist and provider's office to address needs related to level of care concerns  Interventions: . Patient's legal guardian states that she has a placement plan for patient and needs the Fl2 signed by her provider . Patient to be going to [facility] group home and should be placed by 10/24/19 . Per patient's legal guardian, patient has had her TB test and Depo shot, just needs signed FL2 . Fl2 completed by provider's office and  signed-collaboration phone call to the [facility] group home-fax number received-Fl2 faxed over for planned placement date of 10/24/19 . Discussed plans with patient for ongoing care management follow up if needed and provided patient with direct contact information for care management team  Patient Self Care Activities:  . Attends all scheduled provider appointments . Unable to perform ADLs independently . Unable to perform IADLs independently  Please see past updates related to this goal by clicking on the "Past Updates" button in the selected goal          Follow Up Plan: Client's legal guardian will call this social worker with any additonal questions or concerns   Verna Czech, LCSW [facility] Care Management [phone]

## 2019-10-11 ENCOUNTER — Encounter: Payer: Self-pay | Admitting: *Deleted

## 2019-10-11 ENCOUNTER — Ambulatory Visit: Payer: Self-pay | Admitting: *Deleted

## 2019-10-11 DIAGNOSIS — Z658 Other specified problems related to psychosocial circumstances: Secondary | ICD-10-CM

## 2019-10-11 DIAGNOSIS — F840 Autistic disorder: Secondary | ICD-10-CM | POA: Diagnosis not present

## 2019-10-11 DIAGNOSIS — F89 Unspecified disorder of psychological development: Secondary | ICD-10-CM

## 2019-10-11 NOTE — Chronic Care Management (AMB) (Signed)
   Care Management    Clinical Social Work Follow Up Note  10/11/2019 Name: Crystal Hensley MRN: [id number] DOB: [birth year]  [name] is a 24 y.o. year old female who is a primary care patient of Danelle Berry, [place]. The CCM team was consulted for assistance with Level of Care Concerns.   Review of patient status, including review of consultants reports, other relevant assessments, and collaboration with appropriate care team members and the patient's provider was performed as part of comprehensive patient evaluation and provision of chronic care management services.    SDOH (Social Determinants of Health) assessments performed: No    Outpatient Encounter Medications as of 10/11/2019  Medication Sig  . cetirizine (ZYRTEC) 10 MG tablet Take 1 tablet (10 mg total) by mouth daily.  . hydrOXYzine (ATARAX/VISTARIL) 10 MG tablet Take by mouth.  . medroxyPROGESTERone (DEPO-PROVERA) 150 MG/ML injection Inject 1 mL (150 mg total) into the muscle every 3 (three) months.  . sertraline (ZOLOFT) 100 MG tablet Take 100 mg by mouth daily.   No facility-administered encounter medications on file as of 10/11/2019.     Goals Addressed              This Visit's Progress   .  "I need a Fl2 for placement for patient" (pt-stated)        CARE PLAN ENTRY (see longitudinal plan of care for additional care plan information)  Current Barriers:  . Level of care concerns  Clinical Social Work Clinical Goal(s):  Marland Kitchen Over the next 30 days, patient will work with this Child psychotherapist and provider's office to address needs related to level of care concerns  Interventions: . Patient to be going to [facility] group home and should be placed by 10/24/19 . Per patient's legal guardian, patient has had her TB test and Depo shot, just needs signed FL2 . Fl2 completed by provider's office and signed-collaboration phone call to the [facility] group home-fax number received-Fl2 faxed over for planned placement date of  10/24/19, phone call to facility to ensure they have received it . Group Home staff was not able to provide confirmation of receiving th Fl2 and will have to contact the Director and will call this social worker back . Discussed plans with patient for ongoing care management follow up if needed and provided patient with direct contact information for care management team  Patient Self Care Activities:  . Attends all scheduled provider appointments . Unable to perform ADLs independently . Unable to perform IADLs independently  Please see past updates related to this goal by clicking on the "Past Updates" button in the selected goal          Follow Up Plan: SW will follow up with patient by phone over the next 7-10 business days   Verna Czech, [place] Clinical Social Worker  [facility] Care Management [phone]

## 2019-10-11 NOTE — Telephone Encounter (Signed)
This encounter was created in error - please disregard.  This encounter was created in error - please disregard.

## 2019-10-12 ENCOUNTER — Ambulatory Visit: Payer: Self-pay | Admitting: *Deleted

## 2019-10-12 DIAGNOSIS — Z658 Other specified problems related to psychosocial circumstances: Secondary | ICD-10-CM

## 2019-10-12 DIAGNOSIS — F89 Unspecified disorder of psychological development: Secondary | ICD-10-CM

## 2019-10-12 NOTE — Chronic Care Management (AMB) (Signed)
  Chronic Care Management    Clinical Social Work Follow Up Note  10/12/2019 Name: Crystal Hensley MRN: [id number] DOB: Jun 24, 1995  Crystal Hensley is a 24 y.o. year old female who is a primary care patient of Danelle Berry, [place]. The CCM team was consulted for assistance with Level of Care Concerns.   Review of patient status, including review of consultants reports, other relevant assessments, and collaboration with appropriate care team members and the patient's provider was performed as part of comprehensive patient evaluation and provision of chronic care management services.    SDOH (Social Determinants of Health) assessments performed: No    Outpatient Encounter Medications as of 10/12/2019  Medication Sig  . cetirizine (ZYRTEC) 10 MG tablet Take 1 tablet (10 mg total) by mouth daily.  . hydrOXYzine (ATARAX/VISTARIL) 10 MG tablet Take by mouth.  . medroxyPROGESTERone (DEPO-PROVERA) 150 MG/ML injection Inject 1 mL (150 mg total) into the muscle every 3 (three) months.  . sertraline (ZOLOFT) 100 MG tablet Take 100 mg by mouth daily.   No facility-administered encounter medications on file as of 10/12/2019.     Goals Addressed              This Visit's Progress   .  "I need a Fl2 for placement for patient" (pt-stated)        CARE PLAN ENTRY (see longitudinal plan of care for additional care plan information)  Current Barriers:  . Level of care concerns  Clinical Social Work Clinical Goal(s):  Marland Kitchen Over the next 30 days, patient will work with this Child psychotherapist and provider's office to address needs related to level of care concerns  Interventions: . Phone call from  [facility] group home who confirmed receiving the Fl2  needed for placement for 10/24/19 . Per patient's legal guardian, patient has had her TB test and Depo shot, just needs signed FL2 . It was confirmed that no additional information is needed at this time   Patient Self Care Activities:  . Attends all scheduled  provider appointments . Unable to perform ADLs independently . Unable to perform IADLs independently  Please see past updates related to this goal by clicking on the "Past Updates" button in the selected goal          Follow Up Plan: Client's legal guardian will follow up with this social worker if needed in the future   Crystal Dafoe, LCSW Clinical Social Worker  [facility] Care Management [phone]

## 2019-10-15 ENCOUNTER — Ambulatory Visit: Payer: Self-pay | Admitting: *Deleted

## 2019-10-15 DIAGNOSIS — F89 Unspecified disorder of psychological development: Secondary | ICD-10-CM

## 2019-10-15 DIAGNOSIS — Z658 Other specified problems related to psychosocial circumstances: Secondary | ICD-10-CM

## 2019-10-15 NOTE — Patient Instructions (Signed)
Thank you allowing the Chronic Care Management Team to be a part of your care! It was a pleasure speaking with you today!  1. Please call this social worker with any additional community resource needs  CCM (Chronic Care Management) Team   Juanell Fairly RN, BSN Nurse Care Coordinator  [phone]  Suzan Manon [address] LCSW Clinical Social Worker [phone]  Goals Addressed              This Visit's Progress   .  "I need a Fl2 for placement for patient" (pt-stated)        CARE PLAN ENTRY (see longitudinal plan of care for additional care plan information)  Current Barriers:  . Level of care concerns  Clinical Social Work Clinical Goal(s):  Marland Kitchen Over the next 30 days, patient will work with this Child psychotherapist and provider's office to address needs related to level of care concerns  Interventions: . Phone call to patient's legal guardian to confirm that  [facility] group home confirmed has received the Fl2  needed for placement for 10/24/19 . Per patient's legal guardian, patient has had her TB test and Depo shot, just needs signed FL2 . It was confirmed that patient's legal guardian had no additional community resource needs at this time.  . Patient's legal guardian encouraged to contact this social worker with any additional community resource needs   Patient Self Care Activities:  . Attends all scheduled provider appointments . Unable to perform ADLs independently . Unable to perform IADLs independently  Please see past updates related to this goal by clicking on the "Past Updates" button in the selected goal          The patient verbalized understanding of instructions provided today and declined a print copy of patient instruction materials.   No further follow up required: patient's legal guardian to call this social worker with any additional questions or concerns related to patient's community resource needs

## 2019-10-15 NOTE — Chronic Care Management (AMB) (Signed)
Care Management    Clinical Social Work Follow Up Note  10/15/2019 Name: Crystal Hensley MRN: [id number] DOB: 1995-10-17  Crystal Hensley is a 24 y.o. year old female who is a primary care patient of Danelle Berry, [place]. The CCM team was consulted for assistance with Level of Care Concerns.   Review of patient status, including review of consultants reports, other relevant assessments, and collaboration with appropriate care team members and the patient's provider was performed as part of comprehensive patient evaluation and provision of chronic care management services.    SDOH (Social Determinants of Health) assessments performed: No    Outpatient Encounter Medications as of 10/15/2019  Medication Sig  . cetirizine (ZYRTEC) 10 MG tablet Take 1 tablet (10 mg total) by mouth daily.  . hydrOXYzine (ATARAX/VISTARIL) 10 MG tablet Take by mouth.  . medroxyPROGESTERone (DEPO-PROVERA) 150 MG/ML injection Inject 1 mL (150 mg total) into the muscle every 3 (three) months.  . sertraline (ZOLOFT) 100 MG tablet Take 100 mg by mouth daily.   No facility-administered encounter medications on file as of 10/15/2019.     Goals Addressed              This Visit's Progress   .  "I need a Fl2 for placement for patient" (pt-stated)        CARE PLAN ENTRY (see longitudinal plan of care for additional care plan information)  Current Barriers:  . Level of care concerns  Clinical Social Work Clinical Goal(s):  Marland Kitchen Over the next 30 days, patient will work with this Child psychotherapist and provider's office to address needs related to level of care concerns  Interventions: . Phone call to patient's legal guardian to confirm that  [facility] group home confirmed has received the Fl2  needed for placement for 10/24/19 . Per patient's legal guardian, patient has had her TB test and Depo shot, just needs signed FL2 . It was confirmed that patient's legal guardian had no additional community resource needs at this time.   . Patient's legal guardian encouraged to contact this social worker with any additional community resource needs   Patient Self Care Activities:  . Attends all scheduled provider appointments . Unable to perform ADLs independently . Unable to perform IADLs independently  Please see past updates related to this goal by clicking on the "Past Updates" button in the selected goal          Follow Up Plan: Client's legal guardian will call this social worker with any additional community resource needs   [place], LCSW Clinical Social Worker  [facility] Care Management [phone]

## 2019-10-18 DIAGNOSIS — F840 Autistic disorder: Secondary | ICD-10-CM | POA: Diagnosis not present

## 2019-10-23 DIAGNOSIS — L209 Atopic dermatitis, unspecified: Secondary | ICD-10-CM | POA: Insufficient documentation

## 2019-10-24 DIAGNOSIS — F341 Dysthymic disorder: Secondary | ICD-10-CM | POA: Diagnosis not present

## 2019-10-25 DIAGNOSIS — F840 Autistic disorder: Secondary | ICD-10-CM | POA: Diagnosis not present

## 2019-11-01 DIAGNOSIS — F840 Autistic disorder: Secondary | ICD-10-CM | POA: Diagnosis not present

## 2019-11-05 ENCOUNTER — Encounter: Payer: Self-pay | Admitting: Family Medicine

## 2019-11-05 DIAGNOSIS — G3184 Mild cognitive impairment, so stated: Secondary | ICD-10-CM | POA: Insufficient documentation

## 2019-11-08 DIAGNOSIS — F840 Autistic disorder: Secondary | ICD-10-CM | POA: Diagnosis not present

## 2019-11-15 DIAGNOSIS — F840 Autistic disorder: Secondary | ICD-10-CM | POA: Diagnosis not present

## 2019-11-22 ENCOUNTER — Other Ambulatory Visit: Payer: Self-pay | Admitting: Family Medicine

## 2019-11-22 DIAGNOSIS — E559 Vitamin D deficiency, unspecified: Secondary | ICD-10-CM | POA: Diagnosis not present

## 2019-11-22 DIAGNOSIS — E039 Hypothyroidism, unspecified: Secondary | ICD-10-CM | POA: Diagnosis not present

## 2019-11-22 DIAGNOSIS — Z9109 Other allergy status, other than to drugs and biological substances: Secondary | ICD-10-CM

## 2019-11-22 DIAGNOSIS — E7800 Pure hypercholesterolemia, unspecified: Secondary | ICD-10-CM | POA: Diagnosis not present

## 2019-11-26 ENCOUNTER — Other Ambulatory Visit: Payer: Self-pay

## 2019-12-20 ENCOUNTER — Ambulatory Visit
Admission: RE | Admit: 2019-12-20 | Discharge: 2019-12-20 | Disposition: A | Discharge status: Home / Self Care | Payer: Medicaid Other | Source: Ambulatory Visit | Attending: Nurse Practitioner | Admitting: Nurse Practitioner

## 2019-12-20 ENCOUNTER — Ambulatory Visit
Admission: RE | Admit: 2019-12-20 | Discharge: 2019-12-20 | Disposition: A | Discharge status: Home / Self Care | Payer: Medicaid Other | Attending: Nurse Practitioner | Admitting: Nurse Practitioner

## 2019-12-20 ENCOUNTER — Other Ambulatory Visit: Payer: Self-pay | Admitting: Nurse Practitioner

## 2019-12-20 DIAGNOSIS — M419 Scoliosis, unspecified: Secondary | ICD-10-CM

## 2020-01-01 ENCOUNTER — Ambulatory Visit: Payer: Medicaid Other | Admitting: Family Medicine

## 2020-10-24 ENCOUNTER — Other Ambulatory Visit: Payer: Self-pay | Admitting: Nurse Practitioner

## 2020-10-24 DIAGNOSIS — M419 Scoliosis, unspecified: Secondary | ICD-10-CM

## 2020-12-16 ENCOUNTER — Ambulatory Visit
Admission: RE | Admit: 2020-12-16 | Discharge: 2020-12-16 | Disposition: A | Discharge status: Home / Self Care | Payer: Medicaid Other | Source: Ambulatory Visit | Attending: Nurse Practitioner | Admitting: Nurse Practitioner

## 2020-12-16 ENCOUNTER — Ambulatory Visit
Admission: RE | Admit: 2020-12-16 | Discharge: 2020-12-16 | Disposition: A | Discharge status: Home / Self Care | Payer: Medicaid Other | Source: Ambulatory Visit | Attending: Family Medicine | Admitting: Family Medicine

## 2020-12-16 DIAGNOSIS — M419 Scoliosis, unspecified: Secondary | ICD-10-CM | POA: Insufficient documentation

## 2020-12-17 ENCOUNTER — Telehealth: Payer: Self-pay

## 2020-12-17 NOTE — Telephone Encounter (Signed)
Pt is no longer our pt, per caregiver

## 2020-12-17 NOTE — Telephone Encounter (Signed)
Pt needs appt for further refills. Last time seen was Aug 2021

## 2020-12-29 ENCOUNTER — Other Ambulatory Visit: Payer: Self-pay | Admitting: Family Medicine

## 2020-12-29 DIAGNOSIS — Z9109 Other allergy status, other than to drugs and biological substances: Secondary | ICD-10-CM

## 2021-03-21 IMAGING — CR DG SCOLIOSIS EVAL COMPLETE SPINE 2-3V
1 series · 8 of 8 positions shown · non-contrast
Comparison: 01/04/2011

CLINICAL DATA: Evaluate for scoliosis. LOWER back pain.
Kyphoscoliosis.

EXAM:
DG SCOLIOSIS EVAL COMPLETE SPINE 2-3V

[Series 1: dg scoliosis eval complete spine 2 or 3  · 0.14mm/px · 8 of 8 slices shown]
[im 1/8]
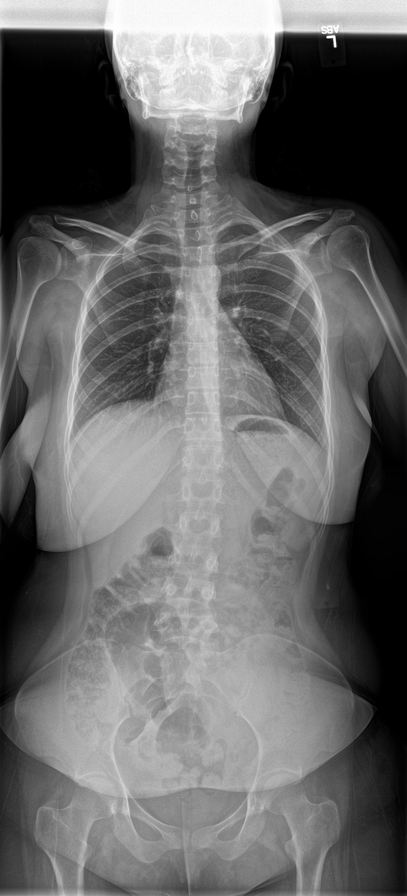
[im 2/8]
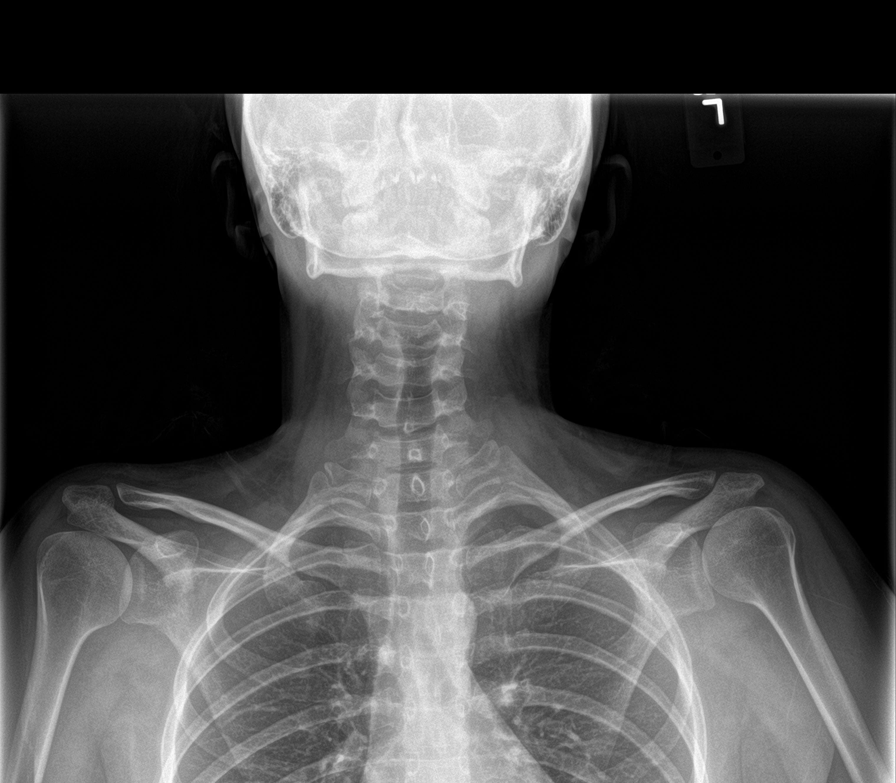
[im 3/8]
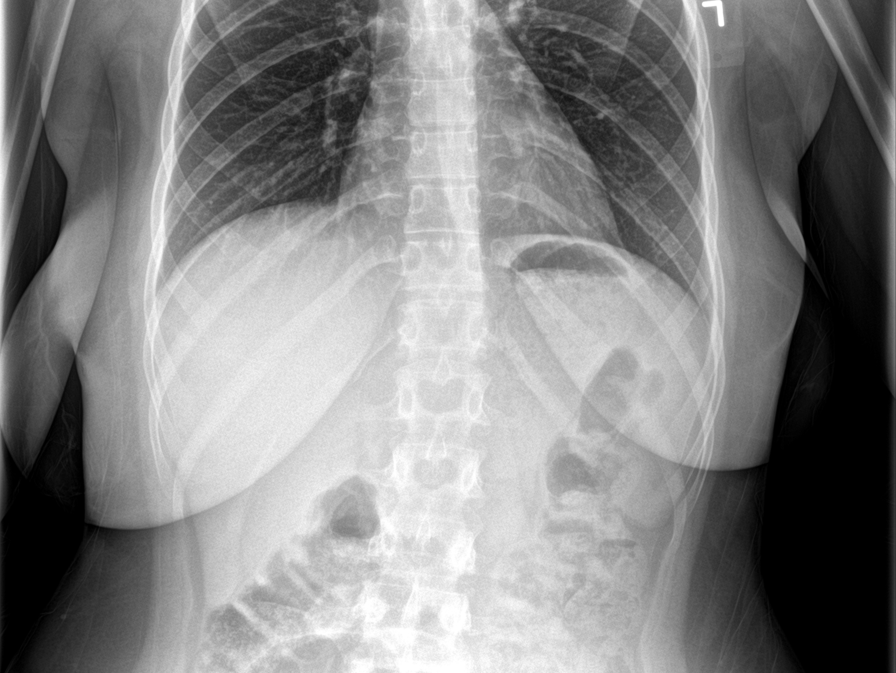
[im 4/8]
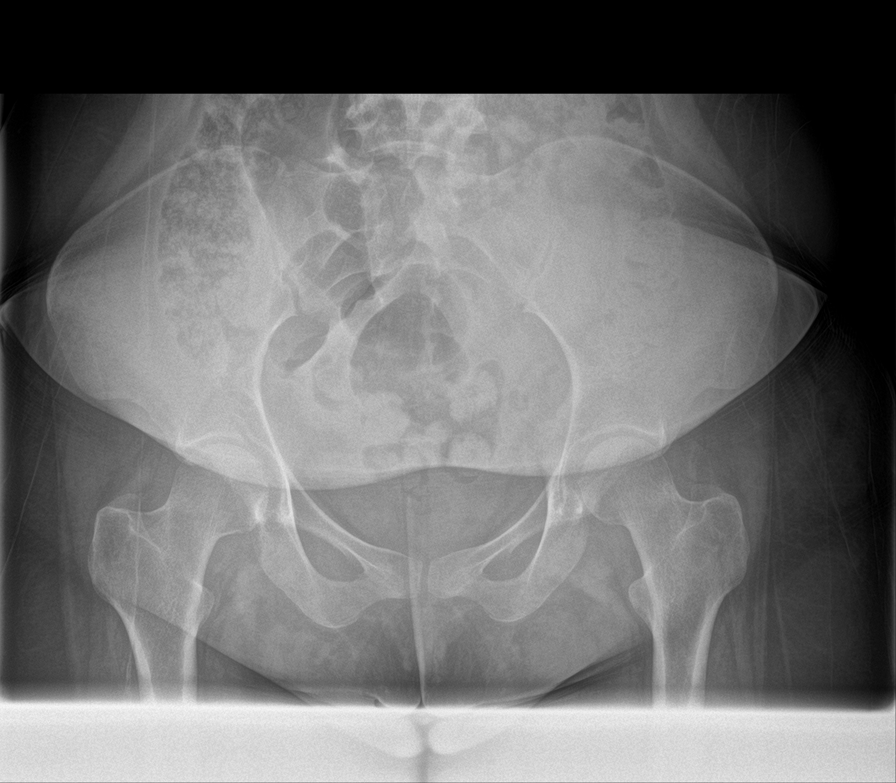
[im 5/8]
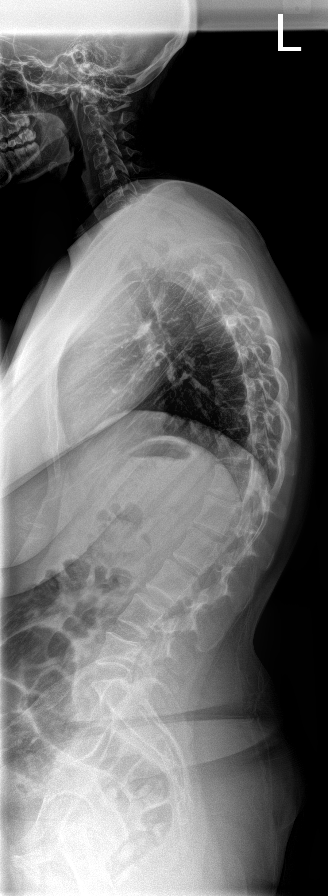
[im 6/8]
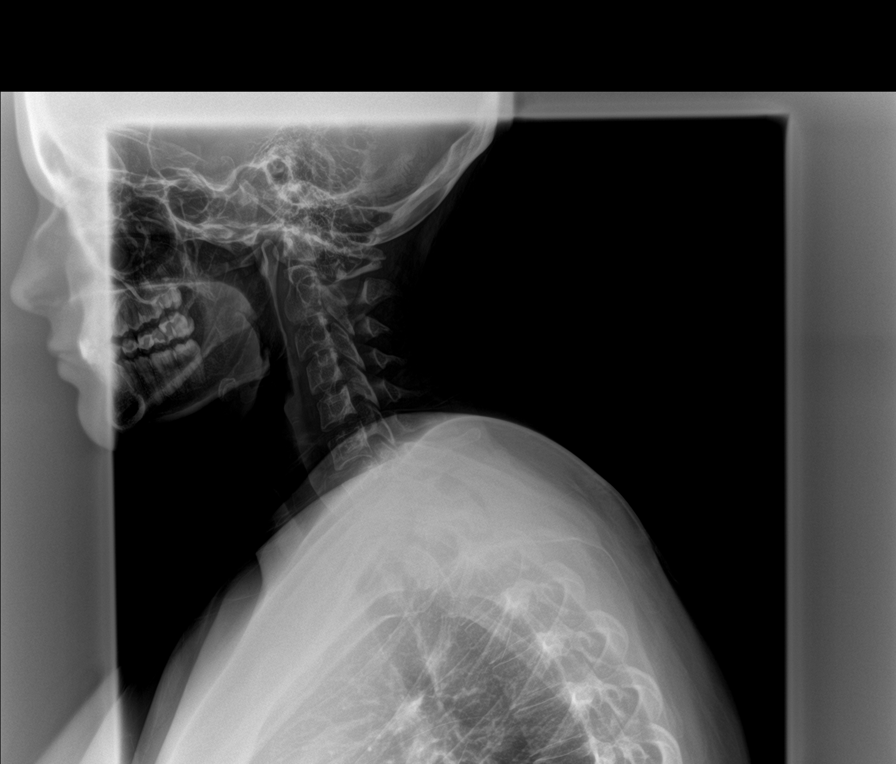
[im 7/8]
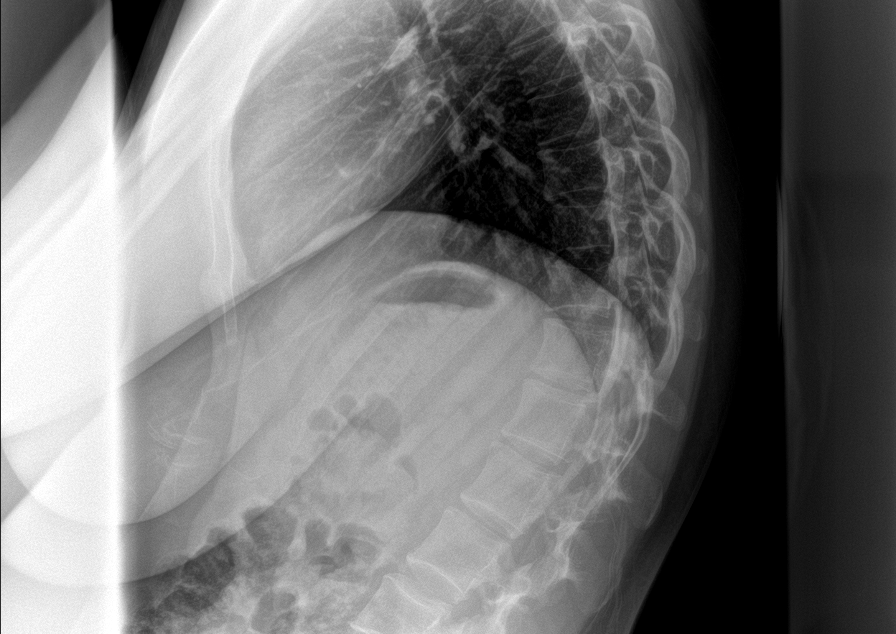
[im 8/8]
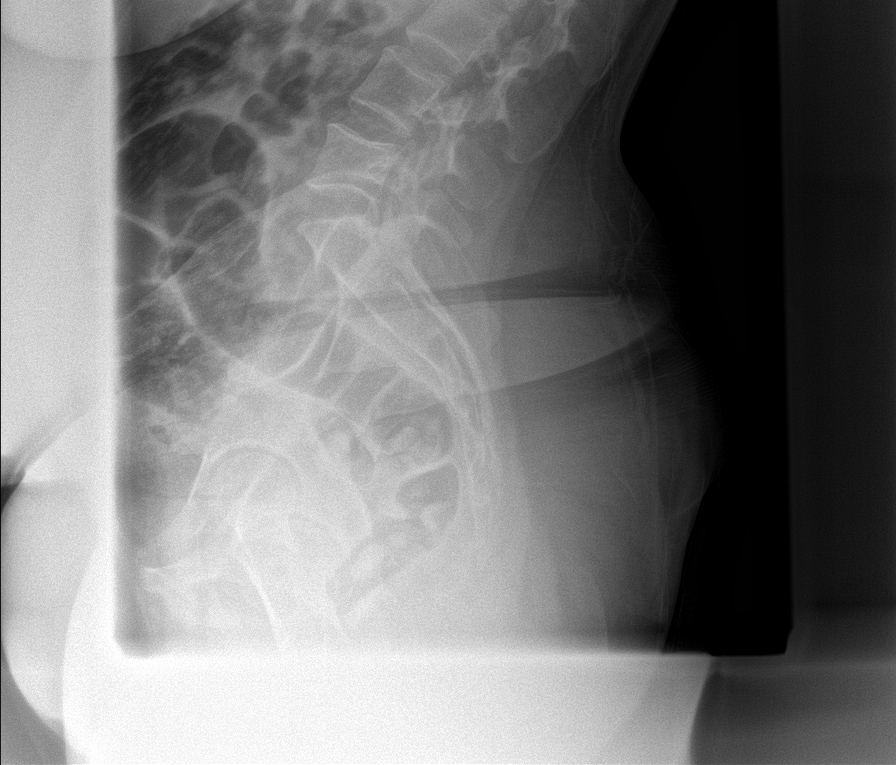

[8 of 8 positions shown; findings below may reference images not displayed]

FINDINGS: There are 5 non rib-bearing vertebral bodies and 11 rib-bearing
vertebral bodies. No hemi vertebrae or other vertebral anomaly.
There is convex LEFT scoliosis of the thoracolumbar spine, centered
at T11 and measuring 8 degrees.

Heart and lungs are unremarkable. Bowel gas pattern is
nonobstructed.
IMPRESSION: 8 degree convex LEFT scoliosis of the thoracolumbar spine.

## 2022-03-18 IMAGING — CR DG SCOLIOSIS EVAL COMPLETE SPINE 2-3V
1 series · 8 of 8 positions shown · non-contrast
Comparison: 12/20/2019

CLINICAL DATA: Scoliosis.

EXAM:
DG SCOLIOSIS EVAL COMPLETE SPINE 2-3V

[Series 1: dg scoliosis eval complete spine 2 or 3  · 0.14mm/px · 8 of 8 slices shown]
[im 1/8]
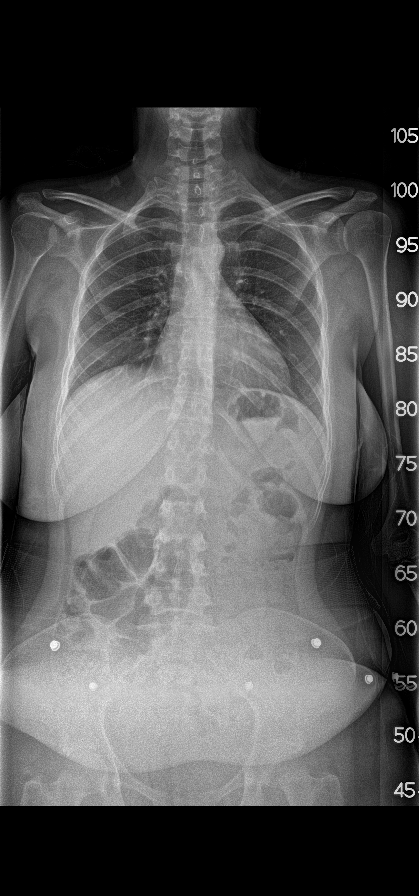
[im 2/8]
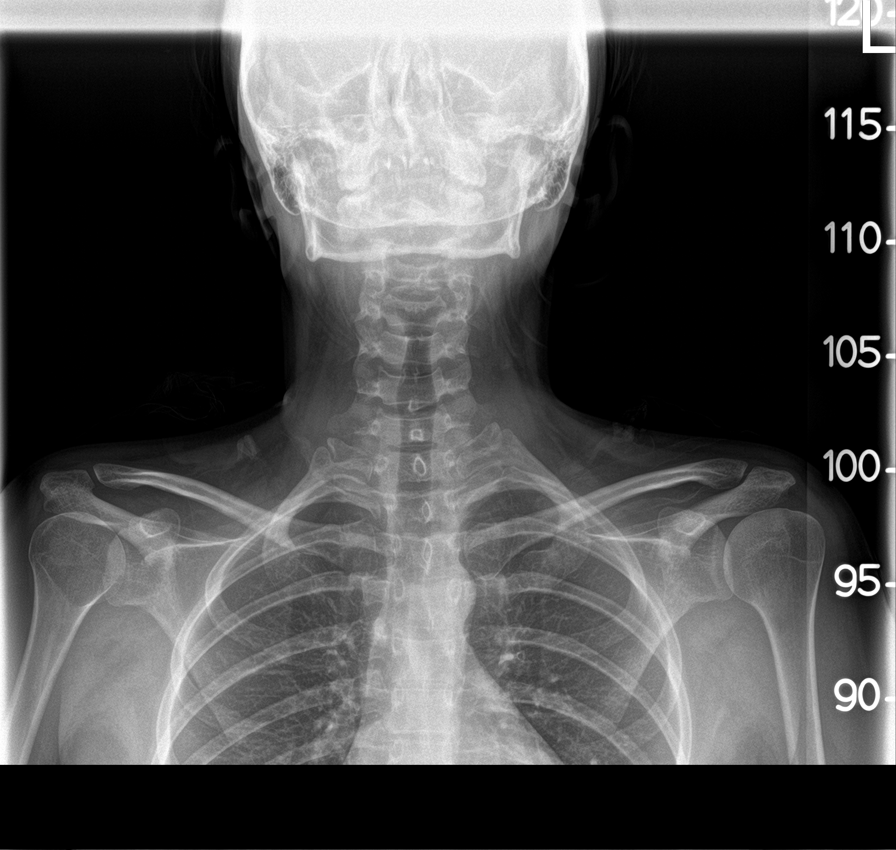
[im 3/8]
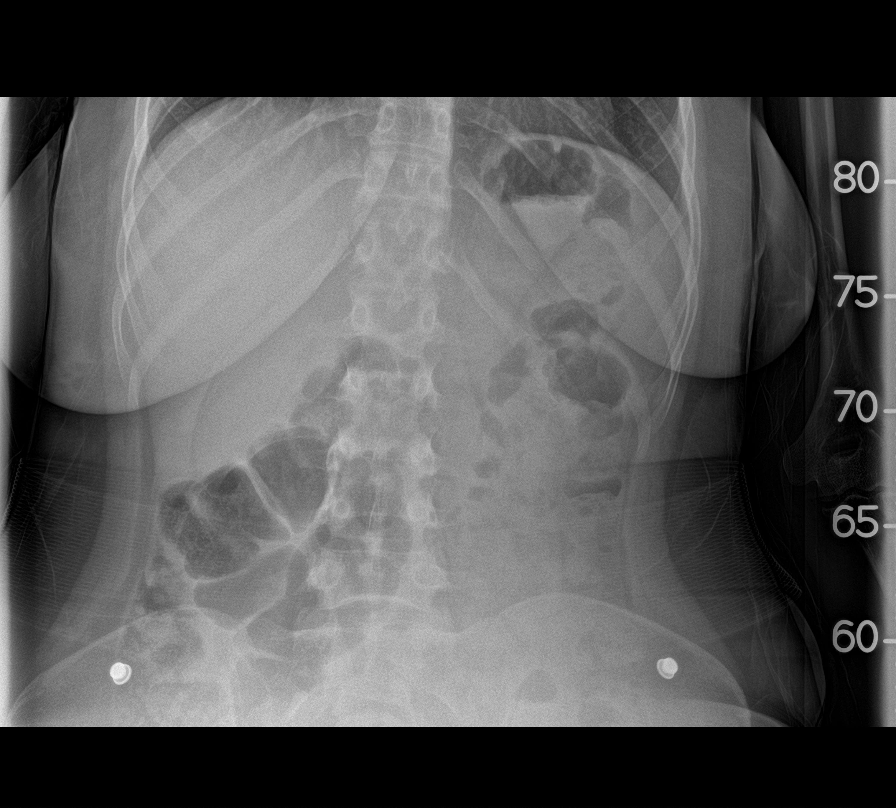
[im 4/8]
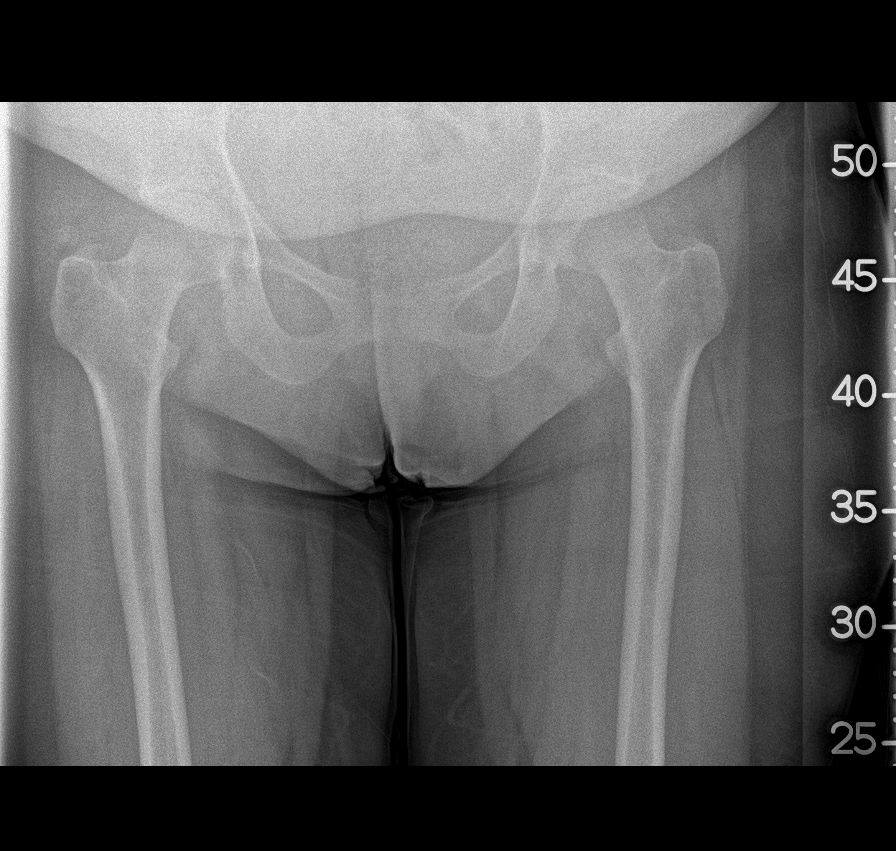
[im 5/8]
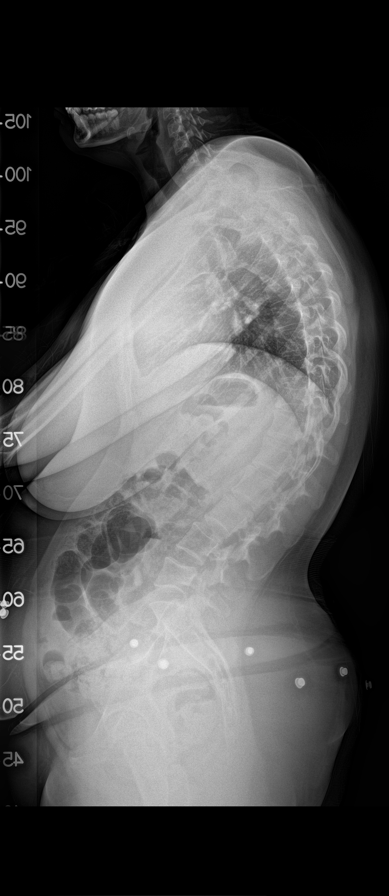
[im 6/8]
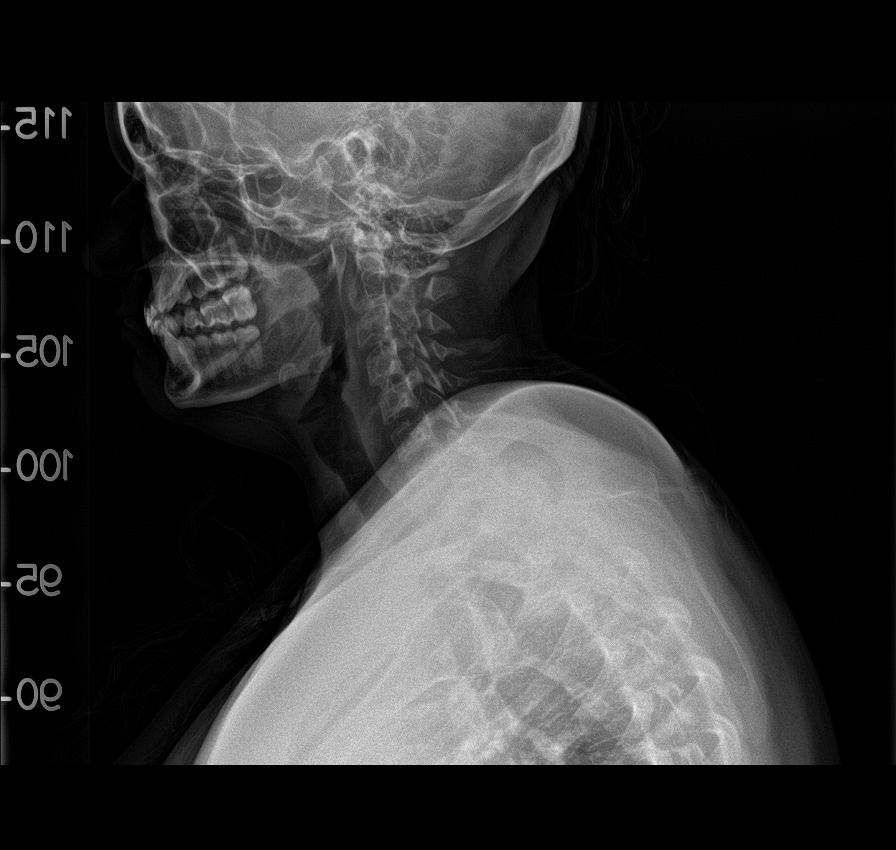
[im 7/8]
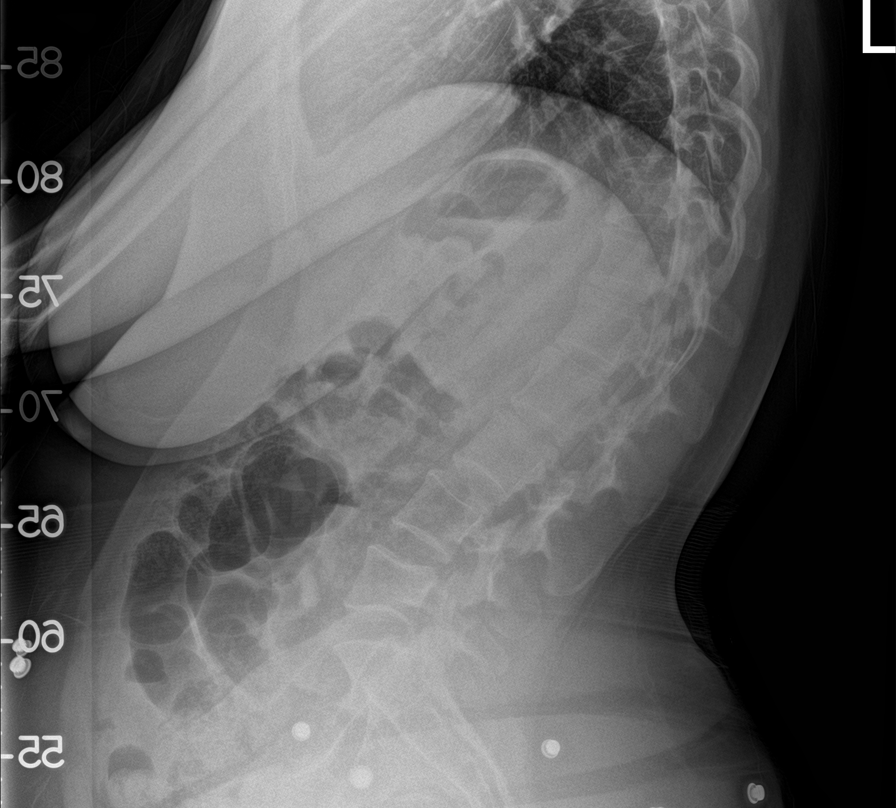
[im 8/8]
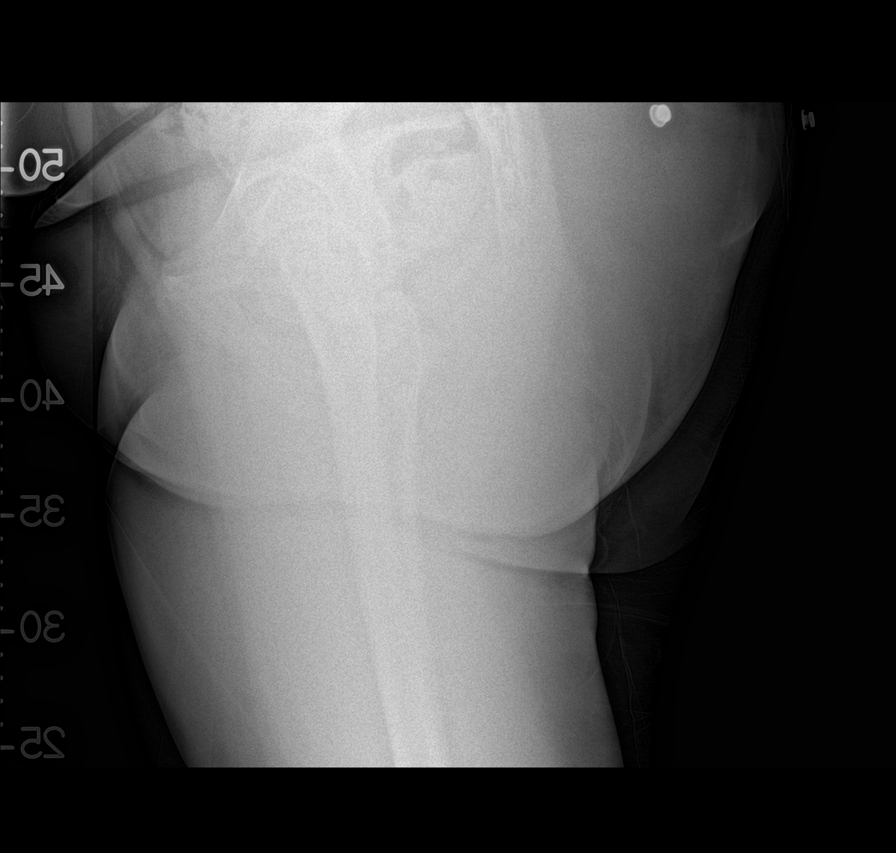

[8 of 8 positions shown; findings below may reference images not displayed]

FINDINGS: As seen on prior exam there are 11 pairs of ribs and 5
non-rib-bearing lumbar vertebra. Decreased levo scoliotic curvature
from prior exam. There is less than 1 degree residual levo scoliotic
curvature of the thoracolumbar spine when measured from superior
endplate of T7 to inferior endplate of L2, previously 8 degrees. No
scoliotic curvature of the cervical spine. There is no intrinsic
vertebral abnormality. Slightly exaggerated lumbar lordosis with
normal thoracic kyphosis. No significant pelvic tilt.
IMPRESSION: Improved scoliotic curvature of the thoracolumbar spine, without
significant curvature. There is slightly exaggerated lumbar
lordosis.

## 2023-04-14 NOTE — Progress Notes (Unsigned)
Referring Provider:  Preferred Primary [facility]  HPI:  Crystal Hensley is a 28 y.o.  G0P0000  who presents today for evaluation and management of abnormal cervical cytology.  Referral source did not send pap cytology, called their office and verbally given pap hx  Prior pap smears:  Date:02/08/22  ZOX:WRUE  HPV: positive  Date: 2024  Pap: N/A HPV: positive Date: 2025  Pap: N/A HPV: positive  Prior cervical / vaginal findings: N/A  Prior cervical treatment(s): N/A  Symptoms/History:  -Abnormal vaginal discharge: yes -Postmenopausal: no -Intermenstrual bleeding: no -Postcoital bleeding: no -Bleeding problems (non-gyn): no -Contraception: pill -Number of current sexual partners: 0 -Number of partners in lifetime: unsure -History of a high risk partner: unsure -History of STDs: hpv -Smoking: no      ROS:  Pertinent items are noted in HPI.  OB History  Gravida Para Term Preterm AB Living  0 0 0 0 0 0  SAB IAB Ectopic Multiple Live Births  0 0 0 0 0    Past Medical History:  Diagnosis Date   Allergy    Anxiety    Bronchitis    Hx of bacterial pneumonia    Sciatica    Scoliosis     Past Surgical History:  Procedure Laterality Date   WISDOM TOOTH EXTRACTION      SOCIAL HISTORY:  Social History   Substance and Sexual Activity  Alcohol Use Never    Social History   Substance and Sexual Activity  Drug Use Never     Family History  Problem Relation Age of Onset   Depression Mother    Diabetes Maternal Grandmother    Arthritis Maternal Grandmother    COPD Maternal Grandmother    Asthma Maternal Grandmother    Lung cancer Maternal Grandfather    Pneumonia Maternal Grandfather    Heart disease Maternal Grandfather    Diabetes Paternal Grandmother    COPD Paternal Grandmother    Hypertension Paternal Grandmother     ALLERGIES:  Patient has no known allergies.  She has a current medication list which includes the following prescription(s): cetirizine,  hydroxyzine, norethindrone-ethinyl estradiol-fe, sertraline, and medroxyprogesterone.  Physical Exam: -Vitals:  BP 119/69   Pulse 81   Ht 5\' 1"  (1.549 m)   Wt 123 lb (55.8 kg)   LMP 03/15/2023   BMI 23.24 kg/m   PROCEDURE: Colposcopy performed with 4% acetic acid and Lugol's after informed consent obtained.  Physical Exam Vitals and nursing note reviewed. Exam conducted with a chaperone present.  Constitutional:      Appearance: Normal appearance.  HENT:     Head: Normocephalic and atraumatic.  Pulmonary:     Effort: Pulmonary effort is normal.  Genitourinary:    General: Normal vulva.        Comments: RED = acetowhite lesions BLUE = biopsies Neurological:     General: No focal deficit present.     Mental Status: She is alert.  Psychiatric:        Behavior: Behavior is agitated.     Comments: Developmentally delayed                          -Aceto-white Lesions Location(s): See above              -Biopsy performed at 1 and 6 o'clock               -ECC indicated and performed: Yes.       -  Biopsy sites made hemostatic with pressure and Monsel's solution   -Satisfactory colposcopy: Yes    -Evidence of Invasive cervical CA :  NO  ASSESSMENT:  Crystal Hensley is a 28 y.o. 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  -ECC and 2 cervical bx sent to pathology -Aftercare instructions for home reviewed, si/sx of when to call/return discussed. -Gardasil #1 given today, complete series -Will call with results and next steps -- discuss with Luna Fuse, group home leader.   Julieanne Manson, DO [facility]

## 2023-04-15 ENCOUNTER — Ambulatory Visit: Payer: Medicare Other | Admitting: Obstetrics

## 2023-04-15 ENCOUNTER — Other Ambulatory Visit (HOSPITAL_COMMUNITY)
Admission: RE | Admit: 2023-04-15 | Discharge: 2023-04-15 | Disposition: A | Discharge status: Home / Self Care | Payer: Medicare Other | Source: Ambulatory Visit | Attending: Obstetrics | Admitting: Obstetrics

## 2023-04-15 ENCOUNTER — Encounter: Payer: Self-pay | Admitting: Obstetrics

## 2023-04-15 VITALS — BP 119/69 | HR 81 | Ht 61.0 in | Wt 123.0 lb

## 2023-04-15 DIAGNOSIS — R87612 Low grade squamous intraepithelial lesion on cytologic smear of cervix (LGSIL): Secondary | ICD-10-CM | POA: Insufficient documentation

## 2023-04-15 DIAGNOSIS — Z23 Encounter for immunization: Secondary | ICD-10-CM | POA: Diagnosis not present

## 2023-04-15 DIAGNOSIS — R87810 Cervical high risk human papillomavirus (HPV) DNA test positive: Secondary | ICD-10-CM | POA: Diagnosis present

## 2023-04-15 DIAGNOSIS — N870 Mild cervical dysplasia: Secondary | ICD-10-CM

## 2023-04-15 NOTE — Addendum Note (Signed)
Addended by: Cornelius Moras D on: 04/15/2023 11:34 AM   Modules accepted: Orders

## 2023-04-18 LAB — SURGICAL PATHOLOGY

## 2023-04-21 NOTE — Progress Notes (Signed)
 Caretaker aware of results and pap in 12 months

## 2023-06-13 ENCOUNTER — Ambulatory Visit: Payer: Medicare Other

## 2023-06-13 ENCOUNTER — Ambulatory Visit: Payer: Medicare Other | Admitting: Obstetrics

## 2023-06-13 NOTE — Progress Notes (Deleted)
    NURSE VISIT NOTE  Subjective:    Patient ID: Crystal Hensley, female    DOB: Jun 29, 1995, 28 y.o.   MRN: [id number]  HPI  Patient is a 28 y.o. G0P0000 female Single {Race/ethnicity:17218} female who presents for her {FIRST SECOND THIRD:18671} Gardasil injection. Order to administer given by {AOB Providers:28529} on ***.   Objective:    There were no vitals taken for this visit.  28 y.o. LMP:  ***  Contraception:  {CCO Contraception:21020264} Given by: {AOB Clinical WUJWJ:19147} Site:  {left/right:311354} deltoid  Lab Review  No results found for any visits on 06/13/23.    Assessment:   No diagnosis found.   Plan:   Patient will return in {Gardasil Return Visit:28539} for {FIRST SECOND THIRD:18671} injection.    Lendon Queen, CMA

## 2023-06-16 ENCOUNTER — Encounter: Payer: Self-pay | Admitting: Obstetrics

## 2023-06-24 ENCOUNTER — Encounter: Payer: Self-pay | Admitting: Obstetrics

## 2023-09-15 ENCOUNTER — Other Ambulatory Visit: Payer: Self-pay | Admitting: Family Medicine

## 2023-09-16 NOTE — Telephone Encounter (Signed)
 Refused this because no longer under care of Michelene Cower.  LOV 2021.

## 2023-10-13 ENCOUNTER — Ambulatory Visit: Payer: Medicare Other

## 2023-12-21 ENCOUNTER — Telehealth: Payer: Self-pay

## 2023-12-21 NOTE — Telephone Encounter (Signed)
 Group home calling to have Leep results faxed to [phone].
